# Patient Record
Sex: Male | Born: 1980 | Race: White | Hispanic: No | Marital: Single | Smoking: Former smoker
Health system: Southern US, Community
[De-identification: ages and names within clinical notes are randomized; demographics above are authoritative.]

## PROBLEM LIST (undated history)

## (undated) DIAGNOSIS — F419 Anxiety disorder, unspecified: Secondary | ICD-10-CM

## (undated) DIAGNOSIS — I471 Supraventricular tachycardia, unspecified: Secondary | ICD-10-CM

---

## 2000-03-17 ENCOUNTER — Emergency Department (HOSPITAL_COMMUNITY): Admission: EM | Admit: 2000-03-17 | Discharge: 2000-03-17 | Payer: Self-pay

## 2000-08-18 ENCOUNTER — Emergency Department (HOSPITAL_COMMUNITY): Admission: EM | Admit: 2000-08-18 | Discharge: 2000-08-18 | Payer: Self-pay | Admitting: Emergency Medicine

## 2000-09-20 ENCOUNTER — Emergency Department (HOSPITAL_COMMUNITY): Admission: EM | Admit: 2000-09-20 | Discharge: 2000-09-20 | Payer: Self-pay | Admitting: Emergency Medicine

## 2008-12-30 ENCOUNTER — Emergency Department (HOSPITAL_COMMUNITY): Admission: EM | Admit: 2008-12-30 | Discharge: 2008-12-30 | Payer: Self-pay | Admitting: Emergency Medicine

## 2009-08-16 ENCOUNTER — Emergency Department (HOSPITAL_COMMUNITY): Admission: EM | Admit: 2009-08-16 | Discharge: 2009-08-17 | Payer: Self-pay | Admitting: Emergency Medicine

## 2010-10-03 ENCOUNTER — Emergency Department (HOSPITAL_COMMUNITY)
Admission: EM | Admit: 2010-10-03 | Discharge: 2010-10-04 | Payer: Self-pay | Source: Home / Self Care | Admitting: Emergency Medicine

## 2010-10-15 ENCOUNTER — Emergency Department (HOSPITAL_COMMUNITY)
Admission: EM | Admit: 2010-10-15 | Discharge: 2010-10-15 | Payer: Self-pay | Source: Home / Self Care | Admitting: Emergency Medicine

## 2010-10-16 LAB — CBC
HCT: 38.7 % — ABNORMAL LOW (ref 39.0–52.0)
Hemoglobin: 12.7 g/dL — ABNORMAL LOW (ref 13.0–17.0)
MCH: 27.5 pg (ref 26.0–34.0)
MCHC: 32.8 g/dL (ref 30.0–36.0)
MCV: 83.9 fL (ref 78.0–100.0)
Platelets: 219 10*3/uL (ref 150–400)
RBC: 4.61 MIL/uL (ref 4.22–5.81)
RDW: 12.9 % (ref 11.5–15.5)
WBC: 6.1 10*3/uL (ref 4.0–10.5)

## 2010-10-16 LAB — DIFFERENTIAL
Basophils Absolute: 0 10*3/uL (ref 0.0–0.1)
Basophils Relative: 1 % (ref 0–1)
Eosinophils Absolute: 0.2 10*3/uL (ref 0.0–0.7)
Eosinophils Relative: 4 % (ref 0–5)
Lymphocytes Relative: 34 % (ref 12–46)
Lymphs Abs: 2.1 10*3/uL (ref 0.7–4.0)
Monocytes Absolute: 0.7 10*3/uL (ref 0.1–1.0)
Monocytes Relative: 12 % (ref 3–12)
Neutro Abs: 3 10*3/uL (ref 1.7–7.7)
Neutrophils Relative %: 49 % (ref 43–77)

## 2010-10-16 LAB — POCT I-STAT, CHEM 8
BUN: 8 mg/dL (ref 6–23)
Calcium, Ion: 1.04 mmol/L — ABNORMAL LOW (ref 1.12–1.32)
Chloride: 105 mEq/L (ref 96–112)
Creatinine, Ser: 0.9 mg/dL (ref 0.4–1.5)
Glucose, Bld: 101 mg/dL — ABNORMAL HIGH (ref 70–99)
HCT: 40 % (ref 39.0–52.0)
Hemoglobin: 13.6 g/dL (ref 13.0–17.0)
Potassium: 3.8 mEq/L (ref 3.5–5.1)
Sodium: 141 mEq/L (ref 135–145)
TCO2: 25 mmol/L (ref 0–100)

## 2010-10-16 LAB — POCT CARDIAC MARKERS
CKMB, poc: <1 ng/mL — ABNORMAL LOW (ref 1.0–8.0)
CKMB, poc: <1 ng/mL — ABNORMAL LOW (ref 1.0–8.0)
Myoglobin, poc: 71.3 ng/mL (ref 12–200)
Myoglobin, poc: 56.9 ng/mL (ref 12–200)
Troponin i, poc: <0.05 ng/mL (ref 0.00–0.09)
Troponin i, poc: <0.05 ng/mL (ref 0.00–0.09)

## 2010-10-16 LAB — D-DIMER, QUANTITATIVE: D-Dimer, Quant: <0.22 ug/mL-FEU (ref 0.00–0.48)

## 2010-11-13 ENCOUNTER — Emergency Department (HOSPITAL_COMMUNITY)
Admission: EM | Admit: 2010-11-13 | Discharge: 2010-11-13 | Disposition: A | Discharge status: Home / Self Care | Payer: 59 | Attending: Emergency Medicine | Admitting: Emergency Medicine

## 2010-11-13 ENCOUNTER — Emergency Department (HOSPITAL_COMMUNITY): Payer: 59

## 2010-11-13 DIAGNOSIS — R0602 Shortness of breath: Secondary | ICD-10-CM | POA: Insufficient documentation

## 2010-11-13 DIAGNOSIS — J45909 Unspecified asthma, uncomplicated: Secondary | ICD-10-CM | POA: Insufficient documentation

## 2010-11-13 DIAGNOSIS — R0789 Other chest pain: Secondary | ICD-10-CM | POA: Insufficient documentation

## 2010-11-13 DIAGNOSIS — I498 Other specified cardiac arrhythmias: Secondary | ICD-10-CM | POA: Insufficient documentation

## 2010-11-13 LAB — BASIC METABOLIC PANEL
BUN: 11 mg/dL (ref 6–23)
CO2: 20 mEq/L (ref 19–32)
Calcium: 8.8 mg/dL (ref 8.4–10.5)
Chloride: 107 mEq/L (ref 96–112)
Creatinine, Ser: 0.76 mg/dL (ref 0.4–1.5)
GFR calc Af Amer: >60 mL/min (ref >60)
GFR calc non Af Amer: >60 mL/min (ref >60)
Glucose, Bld: 119 mg/dL — ABNORMAL HIGH (ref 70–99)
Potassium: 3.5 mEq/L (ref 3.5–5.1)
Sodium: 139 mEq/L (ref 135–145)

## 2010-11-13 LAB — CBC
HCT: 42 % (ref 39.0–52.0)
Hemoglobin: 14.3 g/dL (ref 13.0–17.0)
MCH: 28.4 pg (ref 26.0–34.0)
MCHC: 34 g/dL (ref 30.0–36.0)
MCV: 83.5 fL (ref 78.0–100.0)
Platelets: 233 10*3/uL (ref 150–400)
RBC: 5.03 MIL/uL (ref 4.22–5.81)
RDW: 12.7 % (ref 11.5–15.5)
WBC: 6.2 10*3/uL (ref 4.0–10.5)

## 2010-11-13 LAB — POCT CARDIAC MARKERS
CKMB, poc: <1 ng/mL — ABNORMAL LOW (ref 1.0–8.0)
Myoglobin, poc: 73.8 ng/mL (ref 12–200)
Troponin i, poc: <0.05 ng/mL (ref 0.00–0.09)

## 2010-11-13 LAB — DIFFERENTIAL
Basophils Absolute: 0 10*3/uL (ref 0.0–0.1)
Basophils Relative: 0 % (ref 0–1)
Eosinophils Absolute: 0.2 10*3/uL (ref 0.0–0.7)
Eosinophils Relative: 3 % (ref 0–5)
Lymphocytes Relative: 32 % (ref 12–46)
Lymphs Abs: 2 10*3/uL (ref 0.7–4.0)
Monocytes Absolute: 0.5 10*3/uL (ref 0.1–1.0)
Monocytes Relative: 8 % (ref 3–12)
Neutro Abs: 3.5 10*3/uL (ref 1.7–7.7)
Neutrophils Relative %: 57 % (ref 43–77)

## 2010-11-13 LAB — CK TOTAL AND CKMB (NOT AT ARMC)
CK, MB: 1 ng/mL (ref 0.3–4.0)
Relative Index: INVALID (ref 0.0–2.5)
Total CK: 79 U/L (ref 7–232)

## 2010-11-13 LAB — TSH: TSH: 0.307 u[IU]/mL — ABNORMAL LOW (ref 0.350–4.500)

## 2010-11-13 LAB — TROPONIN I: Troponin I: 0.01 ng/mL (ref 0.00–0.06)

## 2010-11-14 LAB — HEMOGLOBIN A1C
Hgb A1c MFr Bld: 5.2 % (ref <5.7)
Mean Plasma Glucose: 103 mg/dL (ref <117)

## 2010-11-22 ENCOUNTER — Encounter (HOSPITAL_COMMUNITY): Payer: Self-pay

## 2010-11-22 ENCOUNTER — Other Ambulatory Visit (HOSPITAL_COMMUNITY): Payer: Self-pay | Admitting: Cardiology

## 2010-11-22 ENCOUNTER — Ambulatory Visit (HOSPITAL_COMMUNITY)
Admission: RE | Admit: 2010-11-22 | Discharge: 2010-11-22 | Disposition: A | Discharge status: Home / Self Care | Payer: 59 | Source: Ambulatory Visit | Attending: Cardiology | Admitting: Cardiology

## 2010-11-22 DIAGNOSIS — Q231 Congenital insufficiency of aortic valve: Secondary | ICD-10-CM

## 2010-11-22 DIAGNOSIS — R000 Tachycardia, unspecified: Secondary | ICD-10-CM | POA: Insufficient documentation

## 2010-11-22 DIAGNOSIS — Q2381 Bicuspid aortic valve: Secondary | ICD-10-CM

## 2010-11-22 DIAGNOSIS — I77811 Abdominal aortic ectasia: Secondary | ICD-10-CM | POA: Insufficient documentation

## 2010-11-22 HISTORY — PX: DOPPLER ECHOCARDIOGRAPHY: SHX263

## 2010-11-22 HISTORY — PX: NM MYOCAR PERF WALL MOTION: HXRAD629

## 2010-11-22 MED ORDER — IOHEXOL 300 MG/ML  SOLN
100.0000 mL | Freq: Once | INTRAMUSCULAR | Status: AC | PRN
  Administered 2010-11-22: 100 mL via INTRAVENOUS

## 2010-12-11 ENCOUNTER — Encounter: Payer: Self-pay | Admitting: Internal Medicine

## 2010-12-11 ENCOUNTER — Institutional Professional Consult (permissible substitution) (INDEPENDENT_AMBULATORY_CARE_PROVIDER_SITE_OTHER): Payer: 59 | Admitting: Internal Medicine

## 2010-12-11 DIAGNOSIS — R002 Palpitations: Secondary | ICD-10-CM | POA: Insufficient documentation

## 2010-12-11 DIAGNOSIS — G5600 Carpal tunnel syndrome, unspecified upper limb: Secondary | ICD-10-CM | POA: Insufficient documentation

## 2010-12-11 DIAGNOSIS — I498 Other specified cardiac arrhythmias: Secondary | ICD-10-CM | POA: Insufficient documentation

## 2010-12-11 DIAGNOSIS — J45909 Unspecified asthma, uncomplicated: Secondary | ICD-10-CM | POA: Insufficient documentation

## 2010-12-11 DIAGNOSIS — I10 Essential (primary) hypertension: Secondary | ICD-10-CM | POA: Insufficient documentation

## 2010-12-11 DIAGNOSIS — I471 Supraventricular tachycardia: Secondary | ICD-10-CM

## 2010-12-11 DIAGNOSIS — F411 Generalized anxiety disorder: Secondary | ICD-10-CM | POA: Insufficient documentation

## 2010-12-11 DIAGNOSIS — R079 Chest pain, unspecified: Secondary | ICD-10-CM | POA: Insufficient documentation

## 2010-12-11 DIAGNOSIS — G4730 Sleep apnea, unspecified: Secondary | ICD-10-CM | POA: Insufficient documentation

## 2010-12-17 NOTE — Assessment & Plan Note (Signed)
Summary: ep consult; svt ablation . [facility]. per [facility] office [phone]. pt f...   Visit Type:  Initial Consult Referring Provider:  Dr Clarene Duke Primary Provider:  none  CC:  SVT.  History of Present Illness: Bryan Perez is a pleasant 30 yo WM with a h/o documented SVT who presents today for EP consultation.  He reports initially having abrupt onset/ termination of tachypalpitations 18 months ago.  He reports associated anxiety, claminess,  and diaphoresis.  He denies CP but reports mild SOB.  Episodes occur at rest.  He reports that episodes having occured twice after lifting up after bending over to pick up something off of the floor.  He has tried vagal maneuvers without relief.  He has had 3 episodes in the last year and a half.  Episodes have terminated with adenosine.  He is otherwise quite active, without symptoms of CHF or ischemia.  He denies prior presyncope or syncope.  Current Medications (verified): 1)  Alprazolam 0.25 Mg Tabs (Alprazolam) .... Two Times A Day 2)  Diltiazem Hcl Er Beads 240 Mg Xr24h-Cap (Diltiazem Hcl Er Beads) .... Take One Capsule By Mouth Daily  Allergies (verified): No Known Drug Allergies  Past History:  Past Medical History: HYPERTENSION SUPRAVENTRICULAR TACHYCARDIA  Obesity Snores ANXIETY CARPAL TUNNEL SYNDROME ASTHMA Bicuspid aortic valve with mild/moderate AI LVEDD 61 mm  Past Surgical History: none  Family History: Reviewed history from 12/11/2010 and no changes required. MI CAD  Social History: Single and lives in [place]. Shop Teacher, English as a foreign language Tobacco Use - Former. (quit 8/10) Alcohol Use - none  Review of Systems       All systems are reviewed and negative except as listed in the HPI.   Vital Signs:  Patient profile:   30 year old male Height:      72 inches Weight:      291 pounds BMI:     39.61 Pulse rate:   64 / minute BP sitting:   156 / 86  (left arm)  Vitals Entered By: Laurance Flatten CMA (December 11, 2010 3:19  PM)  Physical Exam  General:  overweight, NAD Head:  normocephalic and atraumatic Eyes:  PERRLA/EOM intact; conjunctiva and lids normal. Mouth:  Teeth, gums and palate normal. Oral mucosa normal. Neck:  supple Lungs:  Clear bilaterally to auscultation and percussion. Heart:  Non-displaced PMI, chest non-tender; regular rate and rhythm, S1, S2 without murmurs, rubs or gallops. Carotid upstroke normal, no bruit. Normal abdominal aortic size, no bruits. Femorals normal pulses, no bruits. Pedals normal pulses. No edema, no varicosities. Abdomen:  Bowel sounds positive; abdomen soft and non-tender without masses, organomegaly, or hernias noted. No hepatosplenomegaly. Msk:  Back normal, normal gait. Muscle strength and tone normal. Extremities:  No clubbing or cyanosis. Neurologic:  Alert and oriented x 3. Skin:  Intact without lesions or rashes. Cervical Nodes:  no significant adenopathy Psych:  Normal affect.   Echocardiogram  Procedure date:  11/22/2010  Findings:      LVEF >55%, nl RV size and function, RA/LA size nl, mild Bryan, bicupsid aortic valve with mild to moderate AI, LVEDD 61  Nuclear ETT  Procedure date:  11/22/2010  Findings:      bruce myoview from [facility] office  exercised for 7:36 minutes, 88% max heart rate acheived,  EF 57% possible mild ischemia in the anterolateral territory vs chest wall artifact  EKG  Procedure date:  11/22/2010  Findings:      sinus rhythm 68 bpm, PR 140, otherwise normal  ekg  EKG  Procedure date:  11/13/2010  Findings:      SVT at 208 bpm,  short RP tachycardia  Impression & Recommendations:  Problem # 1:  SUPRAVENTRICULAR TACHYCARDIA (ICD-427.89) Bryan Kafer has clearly documented and quite symptomatic recurrent short RP tachycardia which has previously terminated with adenosine.  This is almost certainly a re-entrant rhythm involving the AV node for at least the anterograde circuit.  Therapeutic strategies for SVT including  medicine and ablation were discussed in detail with the patient today. Risk, benefits, and alternatives to EP study and radiofrequency ablation  were also discussed in detail today. These risks include but are not limited to stroke, bleeding, vascular damage, tamponade, perforation, damage to the heart and other structures, AV block requiring a pacemaker, and death. The patient understands these risk and wishes to further contemplate ablation.  He will continue cardizem and contact our office if he wishes to proceed with ablation. I was quite clear that I thought that he should strongly consider ablation.  If he decides in the future to proceed with ablation, then he will contact my office, otherwise he will continue to follow-up with Dr Clarene Duke.  Problem # 2:  HYPERTENSION (ICD-401.9) salt restriction continue cardizem  Problem # 3:  SLEEP APNEA (ICD-780.57) He thinks that he may have "sleep apnea".  He snores frequently.  His body habitus certainly raises the likelyhood of sleep apnea.  I have encouraged him to follow-up with Dr Clarene Duke for consideration of a sleep study by Dr Tresa Endo.  Patient Instructions: 1)  Your physician has recommended that you have an ablation.  Catheter ablation is a medical procedure used to treat some cardiac arrhythmias (irregular heartbeats). During catheter ablation, a long, thin, flexible tube is put into a blood vessel in your groin (upper thigh), or neck. This tube is called an ablation catheter. It is then guided to your heart through the blood vessel. Radiofrequency waves destroy small areas of heart tissue where abnormal heartbeats may cause an arrhythmia to start.  Please see the instruction sheet given to you today. 2)  Patient will call us if he wants to proceed with SVT ablation

## 2011-01-01 LAB — CBC
HCT: 40.6 % (ref 39.0–52.0)
Hemoglobin: 14 g/dL (ref 13.0–17.0)
MCHC: 34.6 g/dL (ref 30.0–36.0)
MCV: 86.3 fL (ref 78.0–100.0)
Platelets: 172 10*3/uL (ref 150–400)
RBC: 4.7 MIL/uL (ref 4.22–5.81)
RDW: 13 % (ref 11.5–15.5)
WBC: 7.2 10*3/uL (ref 4.0–10.5)

## 2011-01-01 LAB — COMPREHENSIVE METABOLIC PANEL
ALT: 34 U/L (ref 0–53)
AST: 25 U/L (ref 0–37)
Albumin: 3.7 g/dL (ref 3.5–5.2)
Alkaline Phosphatase: 77 U/L (ref 39–117)
BUN: 9 mg/dL (ref 6–23)
CO2: 28 mEq/L (ref 19–32)
Calcium: 8.7 mg/dL (ref 8.4–10.5)
Chloride: 106 mEq/L (ref 96–112)
Creatinine, Ser: 0.76 mg/dL (ref 0.4–1.5)
GFR calc Af Amer: >60 mL/min (ref >60)
GFR calc non Af Amer: >60 mL/min (ref >60)
Glucose, Bld: 128 mg/dL — ABNORMAL HIGH (ref 70–99)
Potassium: 4 mEq/L (ref 3.5–5.1)
Sodium: 140 mEq/L (ref 135–145)
Total Bilirubin: 0.4 mg/dL (ref 0.3–1.2)
Total Protein: 6.6 g/dL (ref 6.0–8.3)

## 2011-01-01 LAB — DIFFERENTIAL
Basophils Absolute: 0 10*3/uL (ref 0.0–0.1)
Basophils Relative: 1 % (ref 0–1)
Eosinophils Absolute: 0.2 10*3/uL (ref 0.0–0.7)
Eosinophils Relative: 2 % (ref 0–5)
Lymphocytes Relative: 28 % (ref 12–46)
Lymphs Abs: 2 10*3/uL (ref 0.7–4.0)
Monocytes Absolute: 0.9 10*3/uL (ref 0.1–1.0)
Monocytes Relative: 12 % (ref 3–12)
Neutro Abs: 4.2 10*3/uL (ref 1.7–7.7)
Neutrophils Relative %: 58 % (ref 43–77)

## 2011-01-01 LAB — POCT CARDIAC MARKERS
CKMB, poc: <1 ng/mL — ABNORMAL LOW (ref 1.0–8.0)
Myoglobin, poc: 59.1 ng/mL (ref 12–200)
Troponin i, poc: <0.05 ng/mL (ref 0.00–0.09)

## 2011-01-08 LAB — POCT CARDIAC MARKERS
CKMB, poc: 1.1 ng/mL (ref 1.0–8.0)
Myoglobin, poc: 79.7 ng/mL (ref 12–200)
Troponin i, poc: <0.05 ng/mL (ref 0.00–0.09)

## 2011-01-08 LAB — POCT I-STAT, CHEM 8
BUN: 12 mg/dL (ref 6–23)
Calcium, Ion: 1.16 mmol/L (ref 1.12–1.32)
Chloride: 105 meq/L (ref 96–112)
Creatinine, Ser: 0.8 mg/dL (ref 0.4–1.5)
Glucose, Bld: 95 mg/dL (ref 70–99)
HCT: 46 % (ref 39.0–52.0)
Hemoglobin: 15.6 g/dL (ref 13.0–17.0)
Potassium: 4.1 mEq/L (ref 3.5–5.1)
Sodium: 140 meq/L (ref 135–145)
TCO2: 28 mmol/L (ref 0–100)

## 2011-01-08 LAB — CBC
HCT: 44.2 % (ref 39.0–52.0)
Hemoglobin: 15.5 g/dL (ref 13.0–17.0)
MCHC: 35.2 g/dL (ref 30.0–36.0)
MCV: 85.8 fL (ref 78.0–100.0)
Platelets: 213 10*3/uL (ref 150–400)
RBC: 5.15 MIL/uL (ref 4.22–5.81)
RDW: 12.9 % (ref 11.5–15.5)
WBC: 6.8 10*3/uL (ref 4.0–10.5)

## 2011-01-08 LAB — DIFFERENTIAL
Basophils Absolute: 0 10*3/uL (ref 0.0–0.1)
Basophils Relative: 0 % (ref 0–1)
Eosinophils Absolute: 0.2 10*3/uL (ref 0.0–0.7)
Eosinophils Relative: 3 % (ref 0–5)
Lymphocytes Relative: 26 % (ref 12–46)
Lymphs Abs: 1.8 10*3/uL (ref 0.7–4.0)
Monocytes Absolute: 0.6 10*3/uL (ref 0.1–1.0)
Monocytes Relative: 8 % (ref 3–12)
Neutro Abs: 4.2 10*3/uL (ref 1.7–7.7)
Neutrophils Relative %: 62 % (ref 43–77)

## 2011-12-25 ENCOUNTER — Other Ambulatory Visit: Payer: Self-pay

## 2011-12-25 ENCOUNTER — Encounter (HOSPITAL_COMMUNITY): Payer: Self-pay

## 2011-12-25 ENCOUNTER — Emergency Department (HOSPITAL_COMMUNITY)
Admission: EM | Admit: 2011-12-25 | Discharge: 2011-12-25 | Disposition: A | Discharge status: Home / Self Care | Payer: 59 | Attending: Emergency Medicine | Admitting: Emergency Medicine

## 2011-12-25 DIAGNOSIS — R000 Tachycardia, unspecified: Secondary | ICD-10-CM | POA: Insufficient documentation

## 2011-12-25 HISTORY — DX: Supraventricular tachycardia: I47.1

## 2011-12-25 HISTORY — DX: Supraventricular tachycardia, unspecified: I47.10

## 2011-12-25 NOTE — Discharge Instructions (Signed)
Continue your diltiazem as prescribed.  Do your best not to miss any doses.  Follow up with Dr. Johney Frame.  You may return to the ER if your symptoms return or you have any other concerns.

## 2011-12-25 NOTE — ED Provider Notes (Signed)
Date: 12/25/2011  Rate: 96  Rhythm: normal sinus rhythm  QRS Axis: normal  Intervals: normal  ST/T Wave abnormalities: normal  Conduction Disutrbances:none  Narrative Interpretation: Normal EKG  Old EKG Reviewed: unchanged    Carleene Cooper III, MD 12/25/11 1550

## 2011-12-25 NOTE — ED Provider Notes (Signed)
History     CSN: [id number]  Arrival date & time 12/25/11  1525   First MD Initiated Contact with Patient 12/25/11 1707      Chief Complaint  Patient presents with  . Irregular Heart Beat    (Consider location/radiation/quality/duration/timing/severity/associated sxs/prior treatment) HPI History provided by pt.   Pt has h/o SVT. 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  It is typical for symptoms to occur at rest.  Tachyarrhythmia lasted for approx one hour and then resolved spontaneously upon arriving in ED.   EMS reported a HR of 186.  No associated CP, SOB, lightheadedness or syncope.  Denies recent illnesses including fever, cough, vomiting, diarrhea, urinary sx.   Has had good rhythm control w/ diltiazem over the past year and most recent episode of SVT was in 10/2010.   However, pt has missed approx 10 doses of diltiazem over the past few weeks d/t forgetfulness.   Per prior chart, was seen by Dr. Johney Frame in 11/2010 and radioablation recommended but pt declined at that time.   Past Medical History  Diagnosis Date  . SVT (supraventricular tachycardia)     No past surgical history on file.  No family history on file.  History  Substance Use Topics  . Smoking status: Not on file  . Smokeless tobacco: Not on file  . Alcohol Use:       Review of Systems  All other systems reviewed and are negative.    Allergies  Review of patient's allergies indicates no known allergies.  Home Medications   Current Outpatient Rx  Name Route Sig Dispense Refill  . ALPRAZOLAM 1 MG PO TABS Oral Take 0.5 mg by mouth 2 (two) times daily.    Marland Kitchen DILTIAZEM HCL ER BEADS 240 MG PO CP24 Oral Take 240 mg by mouth daily.      BP 130/75  Pulse 87  Temp(Src) 99.6 F (37.6 C) (Oral)  Resp 17  SpO2 97%  Physical Exam  Nursing note and vitals reviewed. Constitutional: He is oriented to person, place, and time. He appears  well-developed and well-nourished. No distress.  HENT:  Head: Normocephalic and atraumatic.  Eyes:       Normal appearance  Neck: Normal range of motion.  Cardiovascular: Normal rate and regular rhythm.        HR 85  Pulmonary/Chest: Effort normal and breath sounds normal.  Neurological: He is alert and oriented to person, place, and time.  Skin: Skin is warm and dry. No rash noted.  Psychiatric: He has a normal mood and affect. His behavior is normal.    ED Course  Procedures (including critical care time)  Labs Reviewed - No data to display No results found.   1. Tachyarrhythmia       MDM  Pt w/ h/o SVT presents w/ episode of tachyarrhythmia w/out associated sx today.  EMS reported HR of 186 but resolved spontaneously upon arrival to ED and pt currently in sinus rhythm.  Likely had SVT secondary to non-compliance w/ diltiazem.  Has missed several doses d/t forgetfulness over past few weeks.  I explained the importance of compliance and gave him recommendations for remembering his medication.  Referred back to Dr. Johney Frame who has evaluated him for this problem in the past.  Return precautions discussed.         Arie Sabina [place], [place] 12/25/11 1750

## 2011-12-25 NOTE — ED Notes (Addendum)
Pt arrived via EMS, pt has hx of SVT, states that he has been forgetting to take his home meds. Per EMS pt's HR was in the 180's.

## 2011-12-26 NOTE — ED Provider Notes (Signed)
Medical screening examination/treatment/procedure(s) were performed by non-physician practitioner and as supervising physician I was immediately available for consultation/collaboration.   Carleene Cooper III, MD 12/26/11 1438

## 2013-03-18 ENCOUNTER — Other Ambulatory Visit: Payer: Self-pay | Admitting: Pharmacist Clinician (PhC)/ Clinical Pharmacy Specialist

## 2013-03-18 MED ORDER — DILTIAZEM HCL ER BEADS 240 MG PO CP24: ORAL_CAPSULE | ORAL | Status: DC

## 2013-04-20 ENCOUNTER — Ambulatory Visit: Payer: 59 | Admitting: Cardiology

## 2013-04-20 ENCOUNTER — Other Ambulatory Visit: Payer: Self-pay | Admitting: Cardiology

## 2013-05-01 ENCOUNTER — Encounter: Payer: Self-pay | Admitting: *Deleted

## 2013-05-02 ENCOUNTER — Encounter: Payer: Self-pay | Admitting: Cardiology

## 2013-05-03 ENCOUNTER — Ambulatory Visit: Payer: 59 | Admitting: Cardiology

## 2013-05-22 ENCOUNTER — Other Ambulatory Visit: Payer: Self-pay | Admitting: Cardiology

## 2013-09-23 ENCOUNTER — Emergency Department (HOSPITAL_COMMUNITY)
Admission: EM | Admit: 2013-09-23 | Discharge: 2013-09-23 | Disposition: A | Discharge status: Home / Self Care | Payer: BC Managed Care – PPO | Attending: Emergency Medicine | Admitting: Emergency Medicine

## 2013-09-23 ENCOUNTER — Encounter (HOSPITAL_COMMUNITY): Payer: Self-pay

## 2013-09-23 ENCOUNTER — Emergency Department (HOSPITAL_COMMUNITY): Payer: BC Managed Care – PPO

## 2013-09-23 DIAGNOSIS — M545 Low back pain, unspecified: Secondary | ICD-10-CM | POA: Insufficient documentation

## 2013-09-23 DIAGNOSIS — Z79899 Other long term (current) drug therapy: Secondary | ICD-10-CM | POA: Insufficient documentation

## 2013-09-23 DIAGNOSIS — M549 Dorsalgia, unspecified: Secondary | ICD-10-CM

## 2013-09-23 DIAGNOSIS — K2970 Gastritis, unspecified, without bleeding: Secondary | ICD-10-CM

## 2013-09-23 DIAGNOSIS — R109 Unspecified abdominal pain: Secondary | ICD-10-CM

## 2013-09-23 DIAGNOSIS — Z8679 Personal history of other diseases of the circulatory system: Secondary | ICD-10-CM | POA: Insufficient documentation

## 2013-09-23 LAB — COMPREHENSIVE METABOLIC PANEL
Albumin: 3.8 g/dL (ref 3.5–5.2)
Alkaline Phosphatase: 59 U/L (ref 39–117)
BUN: 8 mg/dL (ref 6–23)
Calcium: 8.9 mg/dL (ref 8.4–10.5)
Creatinine, Ser: 0.76 mg/dL (ref 0.50–1.35)
GFR calc Af Amer: >90 mL/min (ref >90)
Glucose, Bld: 91 mg/dL (ref 70–99)
Total Protein: 6.9 g/dL (ref 6.0–8.3)

## 2013-09-23 LAB — URINALYSIS, ROUTINE W REFLEX MICROSCOPIC
Bilirubin Urine: NEGATIVE
Hgb urine dipstick: NEGATIVE
Ketones, ur: NEGATIVE mg/dL
Nitrite: NEGATIVE
Protein, ur: NEGATIVE mg/dL
Specific Gravity, Urine: 1.009 (ref 1.005–1.030)
Urobilinogen, UA: 0.2 mg/dL (ref 0.0–1.0)

## 2013-09-23 LAB — CBC WITH DIFFERENTIAL/PLATELET
Basophils Absolute: 0 10*3/uL (ref 0.0–0.1)
Eosinophils Relative: 6 % — ABNORMAL HIGH (ref 0–5)
HCT: 47.2 % (ref 39.0–52.0)
Hemoglobin: 16.6 g/dL (ref 13.0–17.0)
Lymphocytes Relative: 29 % (ref 12–46)
Lymphs Abs: 2.5 10*3/uL (ref 0.7–4.0)
MCV: 85.2 fL (ref 78.0–100.0)
Monocytes Absolute: 0.8 10*3/uL (ref 0.1–1.0)
Neutro Abs: 4.7 10*3/uL (ref 1.7–7.7)
RBC: 5.54 MIL/uL (ref 4.22–5.81)
RDW: 12.8 % (ref 11.5–15.5)
WBC: 8.5 10*3/uL (ref 4.0–10.5)

## 2013-09-23 MED ORDER — SODIUM CHLORIDE 0.9 % IV BOLUS (SEPSIS)
1000.0000 mL | Freq: Once | INTRAVENOUS | Status: AC
  Administered 2013-09-23: 1000 mL via INTRAVENOUS

## 2013-09-23 MED ORDER — IOHEXOL 300 MG/ML  SOLN
100.0000 mL | Freq: Once | INTRAMUSCULAR | Status: AC | PRN
  Administered 2013-09-23: 100 mL via INTRAVENOUS

## 2013-09-23 MED ORDER — IOHEXOL 300 MG/ML  SOLN
50.0000 mL | Freq: Once | INTRAMUSCULAR | Status: AC | PRN
  Administered 2013-09-23: 50 mL via ORAL

## 2013-09-23 NOTE — ED Provider Notes (Signed)
Care assumed from [place], NP at shift change. Pt with epigastric abdominal and back pain. CT abdomen pending. 6:14 AM CT results show no acute findings. On exam, pt is well appearing and in NAD, abdomen is soft and non-tender. Stable vital signs. Discussed CT findings. He is stable for discharge. Return precautions given. Patient states understanding of treatment care plan and is agreeable.   Trevor Mace, PA-C 09/23/13 [phone]

## 2013-09-23 NOTE — ED Notes (Signed)
Pt c/o of abd pain x 3 days. Pt states pain started in bil lower back pain. Pt states pain is more epigrastric pain. Pain does not radiate. Pt no nausea but increased bm w/o diarrhea. No vomitting.

## 2013-09-23 NOTE — ED Provider Notes (Signed)
CSN: [id number]     Arrival date & time 09/23/13  0139 History   First MD Initiated Contact with Patient 09/23/13 [phone]     Chief Complaint  Patient presents with  . Abdominal Pain   (Consider location/radiation/quality/duration/timing/severity/associated sxs/prior Treatment) Patient is a 32 y.o. male presenting with abdominal pain. The history is provided by the patient.  Abdominal Pain Pain location:  R flank Pain quality: aching   Pain radiates to:  Epigastric region Pain severity:  Moderate Onset quality:  Gradual Duration:  2 days Timing:  Constant Progression:  Improving Chronicity:  New Context: not alcohol use, not awakening from sleep, not diet changes, not eating, not laxative use, not recent travel and not retching   Relieved by:  Nothing Worsened by:  Movement Ineffective treatments:  Acetaminophen Associated symptoms: no chills, no dysuria, no fever, no hematuria, no nausea, no shortness of breath and no vomiting   Associated symptoms comment:  Early saity   Past Medical History  Diagnosis Date  . SVT (supraventricular tachycardia)    Past Surgical History  Procedure Laterality Date  . Doppler echocardiography  11/22/2010    EF =>55%, LV NORM  . Nm myocar perf wall motion  11/22/2010    EF 57%,lv norm   Family History  Problem Relation Age of Onset  . Heart attack Paternal Grandfather 40    first MI   History  Substance Use Topics  . Smoking status: Not on file  . Smokeless tobacco: Not on file  . Alcohol Use: Not on file    Review of Systems  Constitutional: Negative for fever and chills.  Respiratory: Negative for shortness of breath.   Gastrointestinal: Positive for abdominal pain. Negative for nausea and vomiting.  Genitourinary: Positive for flank pain. Negative for dysuria, frequency and hematuria.  Musculoskeletal: Positive for back pain.  Skin: Negative for rash and wound.  All other systems reviewed and are negative.    Allergies    Review of patient's allergies indicates no known allergies.  Home Medications   Current Outpatient Rx  Name  Route  Sig  Dispense  Refill  . ALPRAZolam (XANAX) 1 MG tablet   Oral   Take 0.5 mg by mouth 2 (two) times daily.         Marland Kitchen amphetamine-dextroamphetamine (ADDERALL) 20 MG tablet   Oral   Take 20 mg by mouth 2 (two) times daily.          BP 145/74  Pulse 81  Temp(Src) 97.8 F (36.6 C) (Oral)  Resp 16  SpO2 100% Physical Exam  Nursing note and vitals reviewed. Constitutional: He appears well-developed and well-nourished. No distress.  HENT:  Head: Normocephalic and atraumatic.  Eyes: Pupils are equal, round, and reactive to light.  Neck: Normal range of motion.  Cardiovascular: Normal rate and regular rhythm.   Pulmonary/Chest: Effort normal and breath sounds normal.  Abdominal: Soft. Bowel sounds are normal. He exhibits no distension. There is no tenderness.  Musculoskeletal: Normal range of motion. He exhibits no edema.       Lumbar back: He exhibits tenderness and pain. He exhibits normal range of motion, no edema and no spasm.       Back:  Neurological: He is alert.  Skin: Skin is warm. No rash noted. No pallor.    ED Course  Procedures (including critical care time) Labs Review Labs Reviewed  COMPREHENSIVE METABOLIC PANEL - Abnormal; Notable for the following:    Sodium 131 (*)    All  other components within normal limits  CBC WITH DIFFERENTIAL - Abnormal; Notable for the following:    Eosinophils Relative 6 (*)    All other components within normal limits  URINALYSIS, ROUTINE W REFLEX MICROSCOPIC   Imaging Review No results found.  EKG Interpretation   None       MDM   1. Abdominal pain   2. Back pain   3. Gastritis        Arman Filter, NP 10/03/13 1953

## 2013-10-06 NOTE — ED Provider Notes (Signed)
Medical screening examination/treatment/procedure(s) were performed by non-physician practitioner and as supervising physician I was immediately available for consultation/collaboration.  EKG Interpretation   None         Hanley SeamenJohn L Alta Goding, MD 10/06/13 1203

## 2014-01-03 ENCOUNTER — Encounter (HOSPITAL_COMMUNITY): Payer: Self-pay | Admitting: Emergency Medicine

## 2014-01-03 ENCOUNTER — Emergency Department (HOSPITAL_COMMUNITY)
Admission: EM | Admit: 2014-01-03 | Discharge: 2014-01-03 | Disposition: A | Discharge status: Home / Self Care | Payer: BC Managed Care – PPO | Attending: Emergency Medicine | Admitting: Emergency Medicine

## 2014-01-03 ENCOUNTER — Emergency Department (HOSPITAL_COMMUNITY): Payer: BC Managed Care – PPO

## 2014-01-03 DIAGNOSIS — Z23 Encounter for immunization: Secondary | ICD-10-CM | POA: Insufficient documentation

## 2014-01-03 DIAGNOSIS — Z87891 Personal history of nicotine dependence: Secondary | ICD-10-CM | POA: Insufficient documentation

## 2014-01-03 DIAGNOSIS — W311XXA Contact with metalworking machines, initial encounter: Secondary | ICD-10-CM | POA: Insufficient documentation

## 2014-01-03 DIAGNOSIS — Y990 Civilian activity done for income or pay: Secondary | ICD-10-CM | POA: Insufficient documentation

## 2014-01-03 DIAGNOSIS — S61011A Laceration without foreign body of right thumb without damage to nail, initial encounter: Secondary | ICD-10-CM

## 2014-01-03 DIAGNOSIS — Z8679 Personal history of other diseases of the circulatory system: Secondary | ICD-10-CM | POA: Insufficient documentation

## 2014-01-03 DIAGNOSIS — Y939 Activity, unspecified: Secondary | ICD-10-CM | POA: Insufficient documentation

## 2014-01-03 DIAGNOSIS — Y929 Unspecified place or not applicable: Secondary | ICD-10-CM | POA: Insufficient documentation

## 2014-01-03 DIAGNOSIS — S61209A Unspecified open wound of unspecified finger without damage to nail, initial encounter: Secondary | ICD-10-CM | POA: Insufficient documentation

## 2014-01-03 DIAGNOSIS — Z79899 Other long term (current) drug therapy: Secondary | ICD-10-CM | POA: Insufficient documentation

## 2014-01-03 DIAGNOSIS — S61409A Unspecified open wound of unspecified hand, initial encounter: Secondary | ICD-10-CM | POA: Insufficient documentation

## 2014-01-03 DIAGNOSIS — Z792 Long term (current) use of antibiotics: Secondary | ICD-10-CM | POA: Insufficient documentation

## 2014-01-03 DIAGNOSIS — F411 Generalized anxiety disorder: Secondary | ICD-10-CM | POA: Insufficient documentation

## 2014-01-03 HISTORY — DX: Anxiety disorder, unspecified: F41.9

## 2014-01-03 MED ORDER — OXYCODONE-ACETAMINOPHEN 7.5-325 MG PO TABS: 1.0000 | ORAL_TABLET | ORAL | Status: AC | PRN

## 2014-01-03 MED ORDER — LIDOCAINE HCL (PF) 1 % IJ SOLN: 5.0000 mL | Freq: Once | INTRAMUSCULAR | Status: DC

## 2014-01-03 MED ORDER — ONDANSETRON HCL 4 MG/2ML IJ SOLN
4.0000 mg | Freq: Once | INTRAMUSCULAR | Status: AC
  Administered 2014-01-03: 4 mg via INTRAVENOUS
  Filled 2014-01-03: qty 2

## 2014-01-03 MED ORDER — CEFAZOLIN SODIUM 1-5 GM-% IV SOLN: 1.0000 g | Freq: Once | INTRAVENOUS | Status: DC

## 2014-01-03 MED ORDER — TETANUS-DIPHTH-ACELL PERTUSSIS 5-2.5-18.5 LF-MCG/0.5 IM SUSP
0.5000 mL | Freq: Once | INTRAMUSCULAR | Status: AC
  Administered 2014-01-03: 0.5 mL via INTRAMUSCULAR
  Filled 2014-01-03: qty 0.5

## 2014-01-03 MED ORDER — CEPHALEXIN 500 MG PO CAPS: 500.0000 mg | ORAL_CAPSULE | Freq: Four times a day (QID) | ORAL | Status: DC

## 2014-01-03 MED ORDER — SODIUM CHLORIDE 0.9 % IV BOLUS (SEPSIS)
1000.0000 mL | Freq: Once | INTRAVENOUS | Status: AC
  Administered 2014-01-03: 1000 mL via INTRAVENOUS

## 2014-01-03 MED ORDER — HYDROMORPHONE HCL PF 1 MG/ML IJ SOLN
1.0000 mg | Freq: Once | INTRAMUSCULAR | Status: AC
  Administered 2014-01-03: 1 mg via INTRAVENOUS
  Filled 2014-01-03: qty 1

## 2014-01-03 NOTE — ED Notes (Signed)
MD at bedside. 

## 2014-01-03 NOTE — ED Provider Notes (Signed)
CSN: [id number]     Arrival date & time 01/03/14  1342 History   First MD Initiated Contact with Patient 01/03/14 1504     Chief Complaint  Patient presents with  . Laceration    right hand     (Consider location/radiation/quality/duration/timing/severity/associated sxs/prior Treatment) Patient is a 33 y.o. male presenting with skin laceration. The history is provided by the patient.  Laceration Location:  Hand Hand laceration location:  R palm Length (cm):  6 Depth:  Through underlying tissue Quality: straight   Bleeding: controlled with pressure   Time since incident:  2 hours Injury mechanism: pt works with drill bit and the drill kicked back and cut palmar area of R proximal thumb. Pain details:    Quality:  Aching   Severity:  Moderate   Timing:  Constant   Progression:  Unchanged Foreign body present:  Unable to specify Relieved by:  Nothing Worsened by:  Nothing tried Ineffective treatments:  None tried Tetanus status:  Unknown   Past Medical History  Diagnosis Date  . SVT (supraventricular tachycardia)   . Anxiety    Past Surgical History  Procedure Laterality Date  . Doppler echocardiography  11/22/2010    EF =>55%, LV NORM  . Nm myocar perf wall motion  11/22/2010    EF 57%,lv norm   Family History  Problem Relation Age of Onset  . Heart attack Paternal Grandfather 40    first MI   History  Substance Use Topics  . Smoking status: Former Games developer  . Smokeless tobacco: Not on file  . Alcohol Use: Yes     Comment: occasionally     Review of Systems  Constitutional: Negative for fever, activity change and appetite change.  HENT: Negative for congestion and rhinorrhea.   Eyes: Negative for discharge and itching.  Respiratory: Negative for cough, shortness of breath and wheezing.   Cardiovascular: Negative for chest pain.  Gastrointestinal: Negative for nausea, vomiting, abdominal pain, diarrhea and constipation.  Genitourinary: Negative for  hematuria, decreased urine volume and difficulty urinating.  Skin: Positive for wound. Negative for rash.  Neurological: Negative for syncope, weakness and numbness.  All other systems reviewed and are negative.      Allergies  Review of patient's allergies indicates no known allergies.  Home Medications   Current Outpatient Rx  Name  Route  Sig  Dispense  Refill  . acetaminophen (TYLENOL) 500 MG tablet   Oral   Take 1,500 mg by mouth every 6 (six) hours as needed for mild pain.         Marland Kitchen ALPRAZolam (XANAX) 1 MG tablet   Oral   Take 0.5 mg by mouth 2 (two) times daily.         Marland Kitchen amphetamine-dextroamphetamine (ADDERALL) 20 MG tablet   Oral   Take 20 mg by mouth 2 (two) times daily.         . cephALEXin (KEFLEX) 500 MG capsule   Oral   Take 1 capsule (500 mg total) by mouth 4 (four) times daily.   28 capsule   0   . oxyCODONE-acetaminophen (PERCOCET) 7.5-325 MG per tablet   Oral   Take 1 tablet by mouth every 4 (four) hours as needed for pain.   20 tablet   0    BP 133/71  Pulse 85  Temp(Src) 98.1 F (36.7 C) (Oral)  Resp 20  SpO2 99% Physical Exam  Vitals reviewed. Constitutional: He is oriented to person, place, and time. He appears well-developed  and well-nourished. No distress.  HENT:  Head: Normocephalic and atraumatic.  Mouth/Throat: Oropharynx is clear and moist. No oropharyngeal exudate.  Eyes: Conjunctivae and EOM are normal. Pupils are equal, round, and reactive to light. Right eye exhibits no discharge. Left eye exhibits no discharge. No scleral icterus.  Neck: Normal range of motion. Neck supple.  Cardiovascular: Normal rate, regular rhythm, normal heart sounds and intact distal pulses.  Exam reveals no gallop and no friction rub.   No murmur heard. Pulmonary/Chest: Effort normal and breath sounds normal. No respiratory distress. He has no wheezes. He has no rales.  Abdominal: Soft. He exhibits no distension and no mass. There is no  tenderness.  Musculoskeletal:  Proximal R thumb, palmar aspect, straight laceration, approximates mildly poorly, on palmar aspect wrapping to radial aspect. NVI distally. No active bleeding currently  Neurological: He is alert and oriented to person, place, and time. No cranial nerve deficit. He exhibits normal muscle tone. Coordination normal.  Skin: Skin is warm. No rash noted. He is not diaphoretic.    ED Course  LACERATION REPAIR Date/Time: 01/03/2014 11:19 PM Performed by: Pilar JarvisBRTALIK, Dominick Morella Authorized by: Dagmar HaitWALDEN, WILLIAM BLAIR Consent: Verbal consent obtained. Risks and benefits: risks, benefits and alternatives were discussed Consent given by: patient Patient understanding: patient states understanding of the procedure being performed Patient consent: the patient's understanding of the procedure matches consent given Site marked: the operative site was marked Imaging studies: imaging studies available Required items: required blood products, implants, devices, and special equipment available Patient identity confirmed: verbally with patient, arm band and hospital-assigned identification number Time out: Immediately prior to procedure a "time out" was called to verify the correct patient, procedure, equipment, support staff and site/side marked as required. Body area: upper extremity Location details: right hand Laceration length: 6 cm Contamination: The wound is contaminated. Foreign body present: grease from work. Tendon involvement: none Nerve involvement: none Vascular damage: no Anesthesia: digital block Local anesthetic: lidocaine 1% with epinephrine Anesthetic total: 5 ml Patient sedated: no Preparation: Patient was prepped and draped in the usual sterile fashion. Irrigation solution: saline Irrigation method: jet lavage Amount of cleaning: standard Debridement: minimal Degree of undermining: none Wound skin closure material used: 4-0 Vicryl. Subcutaneous closure: 4-0  Vicryl Fascia closure: 4-0 Vicryl Number of sutures: 9 Technique: simple Approximation: close Approximation difficulty: complex Dressing: antibiotic ointment, 4x4 sterile gauze and gauze roll Patient tolerance: Patient tolerated the procedure well with no immediate complications.   (including critical care time) Labs Review Labs Reviewed - No data to display Imaging Review Dg Finger Thumb Right  01/03/2014   CLINICAL DATA:  Laceration.  Pain.  EXAM: RIGHT THUMB 2+V  COMPARISON:  None.  FINDINGS: There is no evidence of fracture or dislocation. There is no evidence of arthropathy or other focal bone abnormality. Soft tissues are unremarkable  IMPRESSION: Negative.   Electronically Signed   By: Davonna BellingJohn  Curnes M.D.   On: 01/03/2014 15:43     EKG Interpretation None      MDM   MDM: 33 y.o WM w/ cc: of laceration to thumb while at work. 2 hours ago, RHD, unsure of tetanus status. No other complaints. No numbness or weakness. States having laarge amount of pain at R thumb at lac. Lac is palmar aspect, through deep tissue, wraps to radial aspect. NVI. No current bleeding. Concern for Open Fx, Will update Tetanus, give pain meds and obtain XR. XR with no fx. Lac repaired as above with good approximation. Dressed with gauze.  Will have f/u with hand surgeon. With it being complex thumb lac, we suggested patient to not work. He insisted that he work, even when explained risks of further damage, and infection. We will have f/u with Ortho Dr. Janee Morn in 2 days for wound recheck. Given Keflex rx. Pain medicine rx. Discharged. Care of case d/w my attending.  Final diagnoses:  Laceration of right thumb    Discharged  Pilar Jarvis, MD 01/03/14 2322

## 2014-01-03 NOTE — Discharge Instructions (Signed)

## 2014-01-03 NOTE — ED Notes (Signed)
Onset 12:45p pt at work and drill press kicked back and cut right thumb and abrasion to anterior index finger.  Pt immediately wrapped hand in rag, stated it was "squirting blood".  When ems arrived, they said it was oozing blood at that time.  EMS redressed wound.   Ems said at one point pt went SB and BP systolic dropped to 80's.  EMS gave fluids, put HOB down and BP and HR increased.  Gave Fentanyl 200 mcg.  Pain at 7/10 now.

## 2014-01-03 NOTE — ED Provider Notes (Signed)
I saw and evaluated the patient, reviewed the resident's note and I agree with the findings and plan.   EKG Interpretation None      Thumb laceration on dominant hand. Xray negative for fx. Tetanus updated here. NVI, good ROM, good cap refill. Will give Keflex. Repaired here, instructed to f/u with Dr. Janee Mornhompson in 2-3 days for recheck.  Dagmar HaitWilliam Lilya Smitherman, MD 01/03/14 [phone]

## 2015-05-06 IMAGING — CT CT ABD-PELV W/ CM
1 of 2 series · 15 of 32 positions shown, 19 images · IV contrast (100 ML OMNI 300)
Comparison: None.

CLINICAL DATA: Right upper quadrant abdominal pain and nausea.

EXAM:
CT ABDOMEN AND PELVIS WITH CONTRAST
TECHNIQUE: Multidetector CT imaging of the abdomen and pelvis was performed
using the standard protocol following bolus administration of
intravenous contrast.
CONTRAST:  100mL OMNIPAQUE IOHEXOL 300 MG/ML  SOLN

[Series 2: abd/pel with · axial · 0.78mm/px · z∈[+1076,+1536]mm · 15 of 100 slices shown, 19 images]
[im 4/100  soft-tissue]
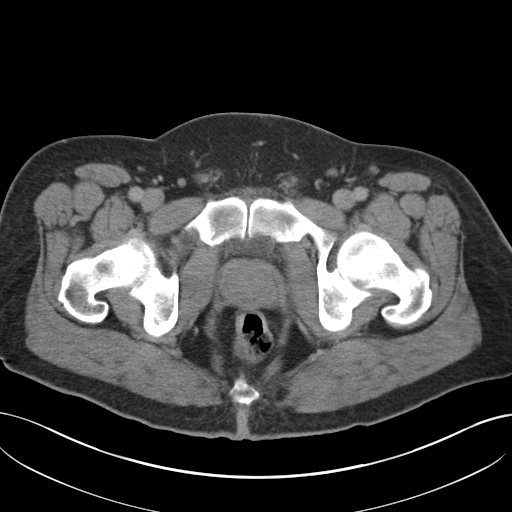
[im 4/100  bone]
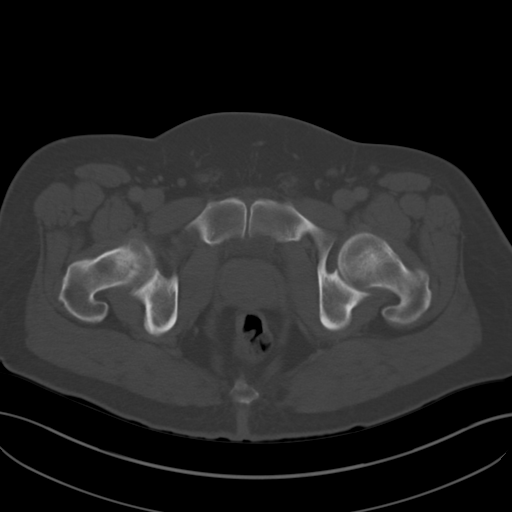
[im 12/100  soft-tissue]
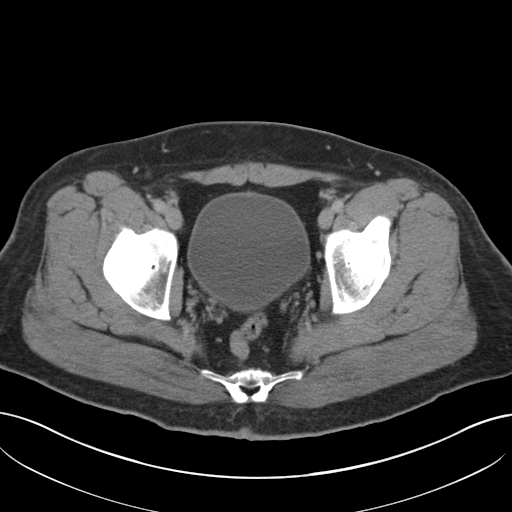
[im 20/100  soft-tissue]
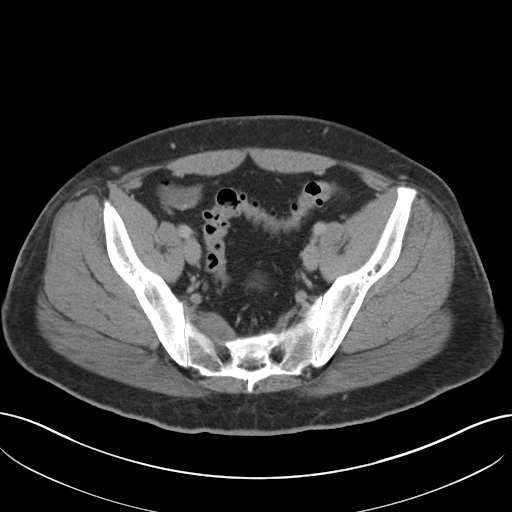
[im 28/100  soft-tissue]
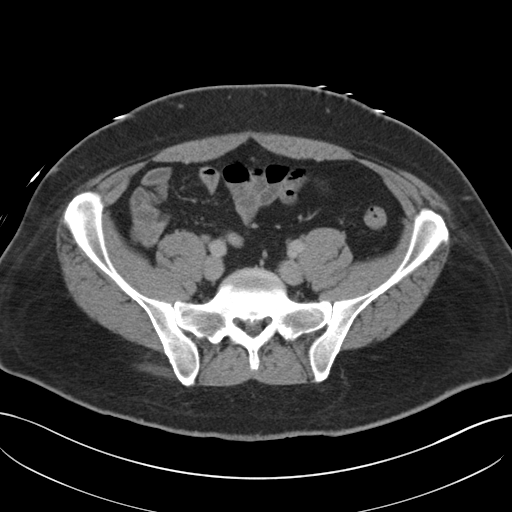
[im 36/100  soft-tissue]
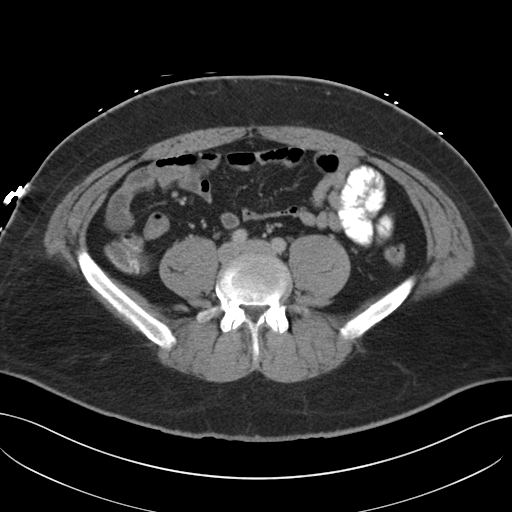
[im 44/100  soft-tissue]
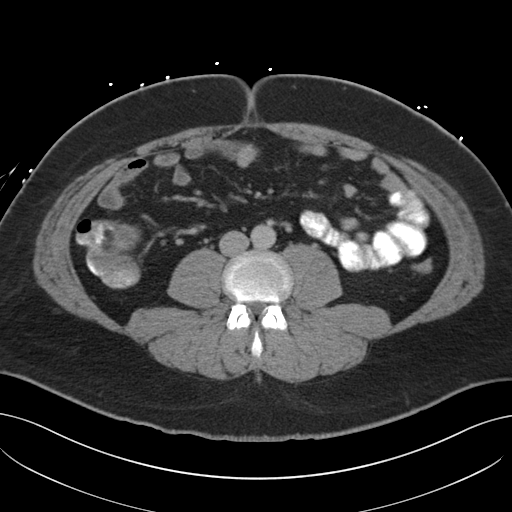
[im 52/100  soft-tissue]
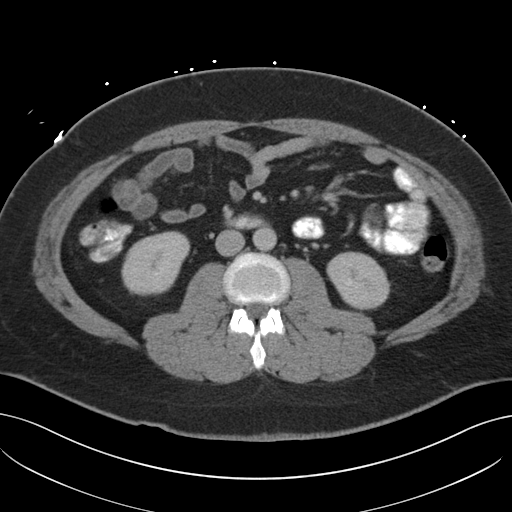
[im 56/100  soft-tissue]
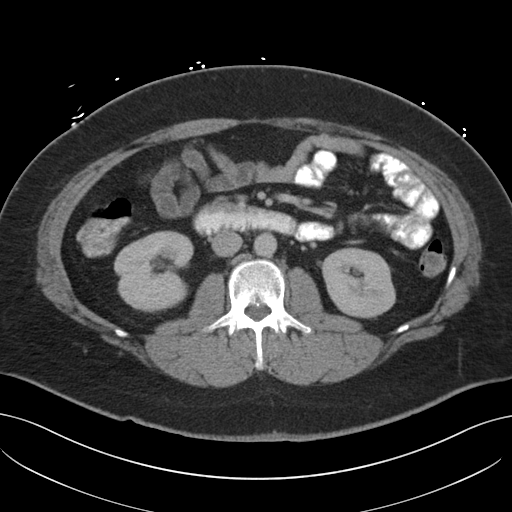
[im 64/100  soft-tissue]
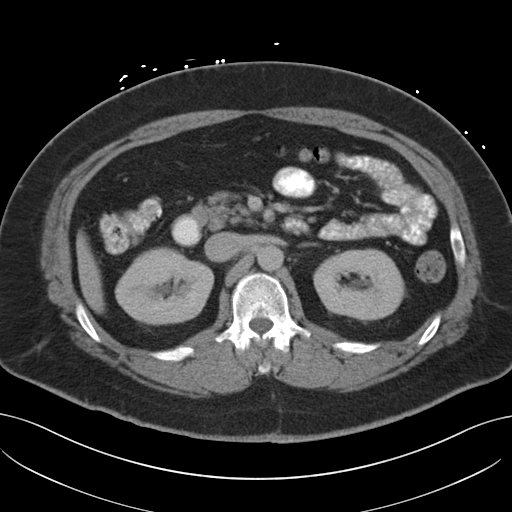
[im 64/100  bone]
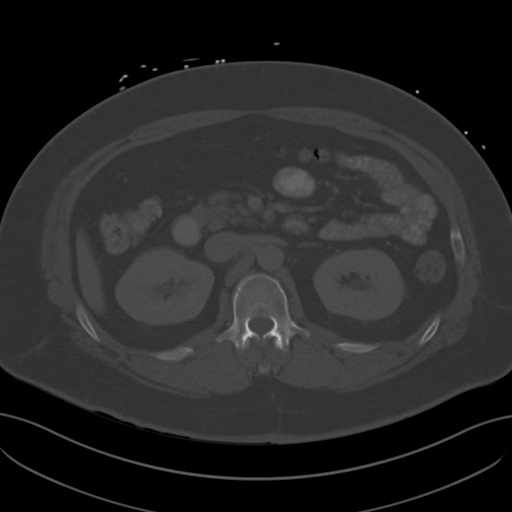
[im 72/100  soft-tissue]
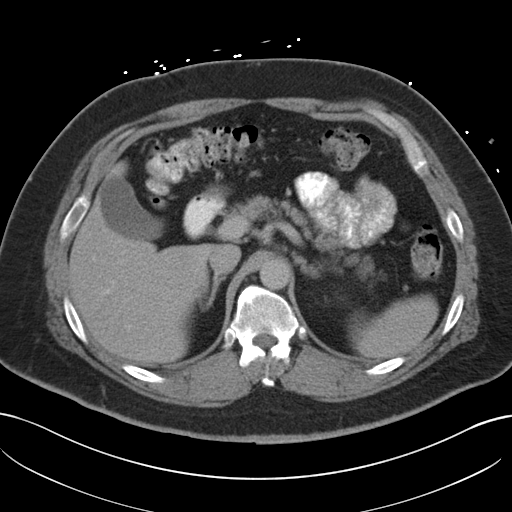
[im 80/100  soft-tissue]
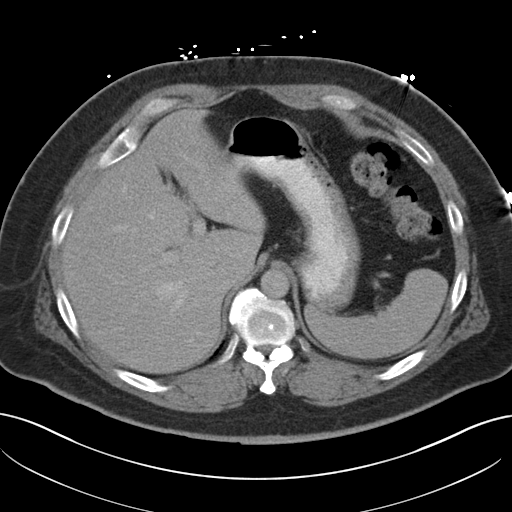
[im 84/100  lung]
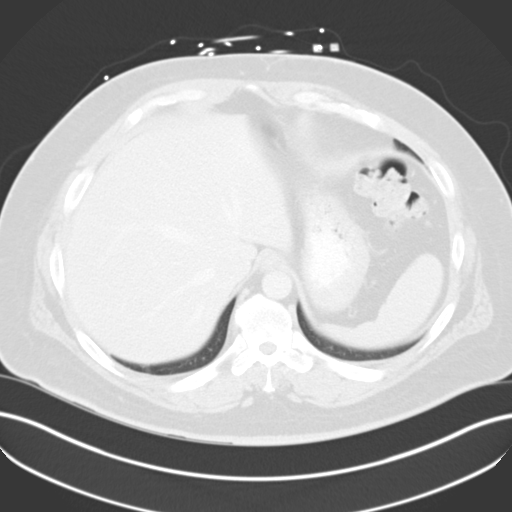
[im 88/100  soft-tissue]
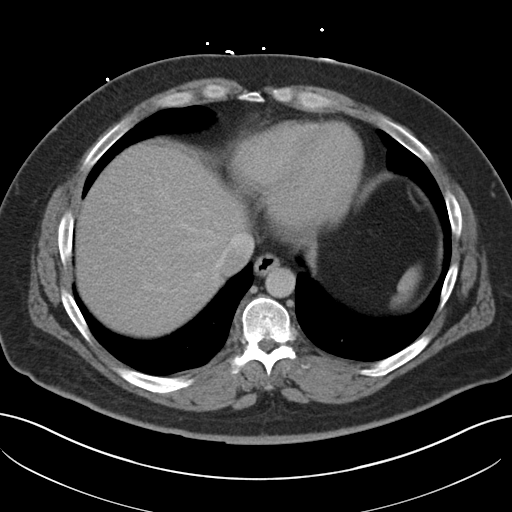
[im 88/100  lung]
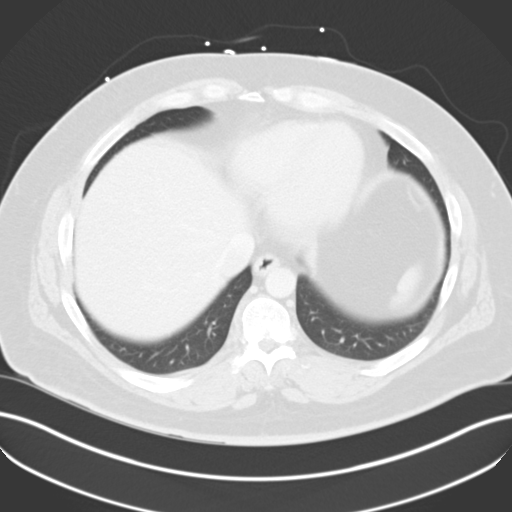
[im 92/100  lung]
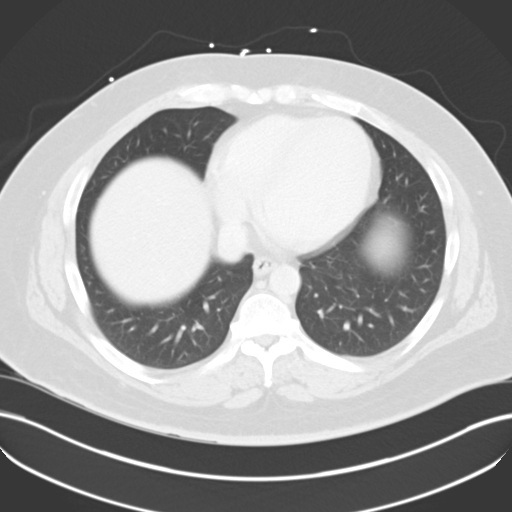
[im 96/100  soft-tissue]
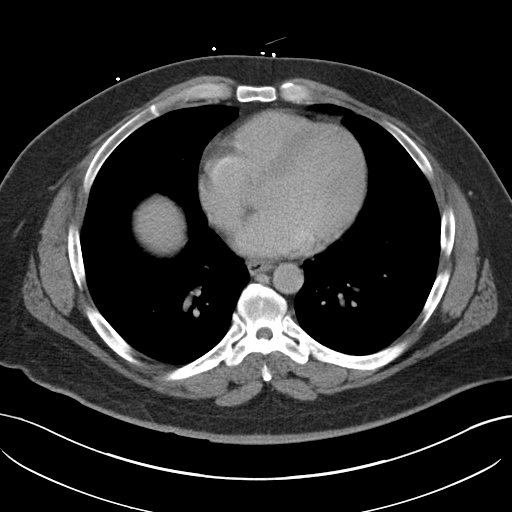
[im 96/100  lung]
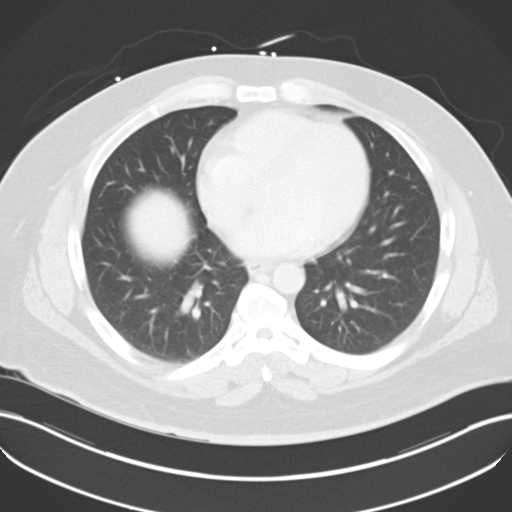

[15 of 32 positions shown; findings below may reference images not displayed]

FINDINGS: The visualized lung bases are clear.

The liver and spleen are unremarkable in appearance. The gallbladder
is within normal limits. The pancreas and adrenal glands are
unremarkable.

Minimal nonspecific perinephric stranding is noted bilaterally. The
kidneys are otherwise unremarkable in appearance. There is no
evidence of hydronephrosis. No renal or ureteral stones are seen.

No free fluid is identified. The small bowel is unremarkable in
appearance. The stomach is within normal limits. No acute vascular
abnormalities are seen.

The appendix is normal in caliber, without evidence for
appendicitis. Contrast progresses to the level of the mid transverse
colon. The colon is unremarkable in appearance.

The bladder is mildly distended and grossly unremarkable in
appearance. A small urachal remnant is incidentally seen. The
prostate remains normal in size. No inguinal lymphadenopathy is
seen.

No acute osseous abnormalities are identified.
IMPRESSION: Unremarkable contrast-enhanced CT of the abdomen and pelvis.

## 2015-08-16 IMAGING — CR DG FINGER THUMB 2+V*R*
2 series · 2 of 2 positions shown · non-contrast
Comparison: None.

CLINICAL DATA: Laceration.  Pain.

EXAM:
RIGHT THUMB 2+V

[pa obl]
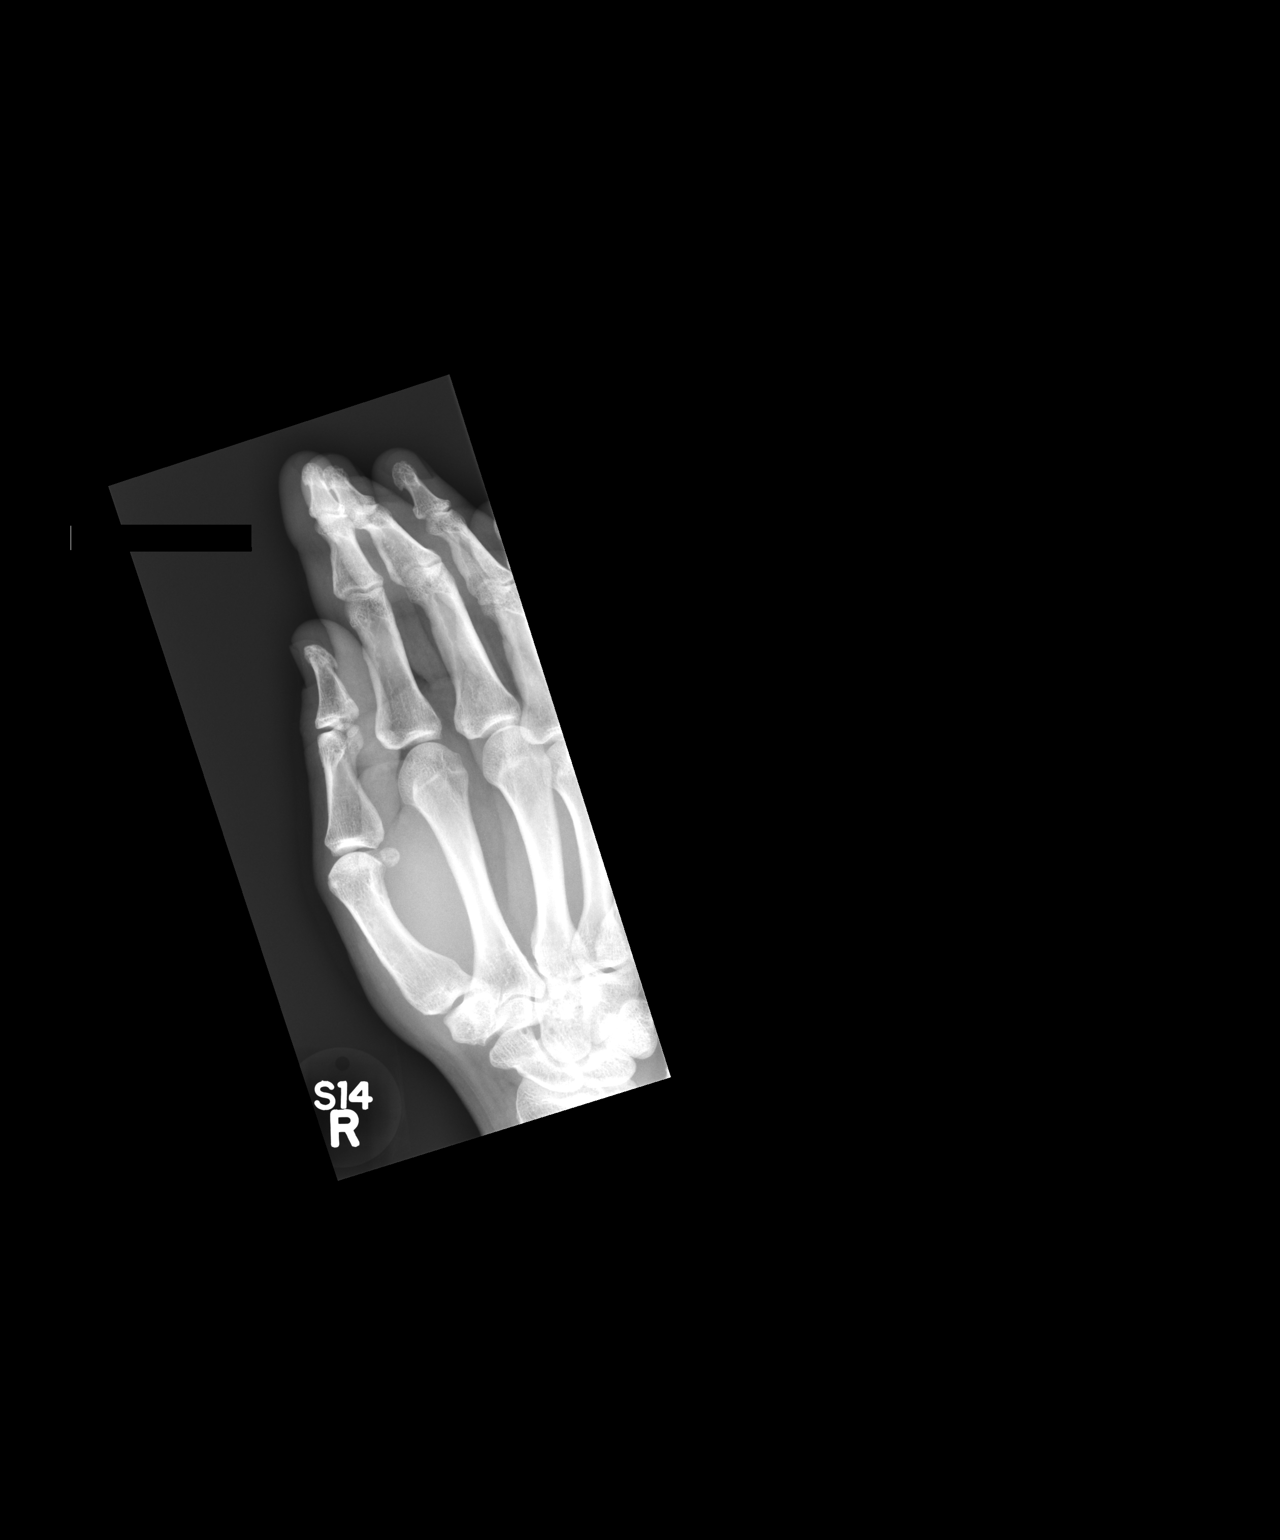

[lateral]
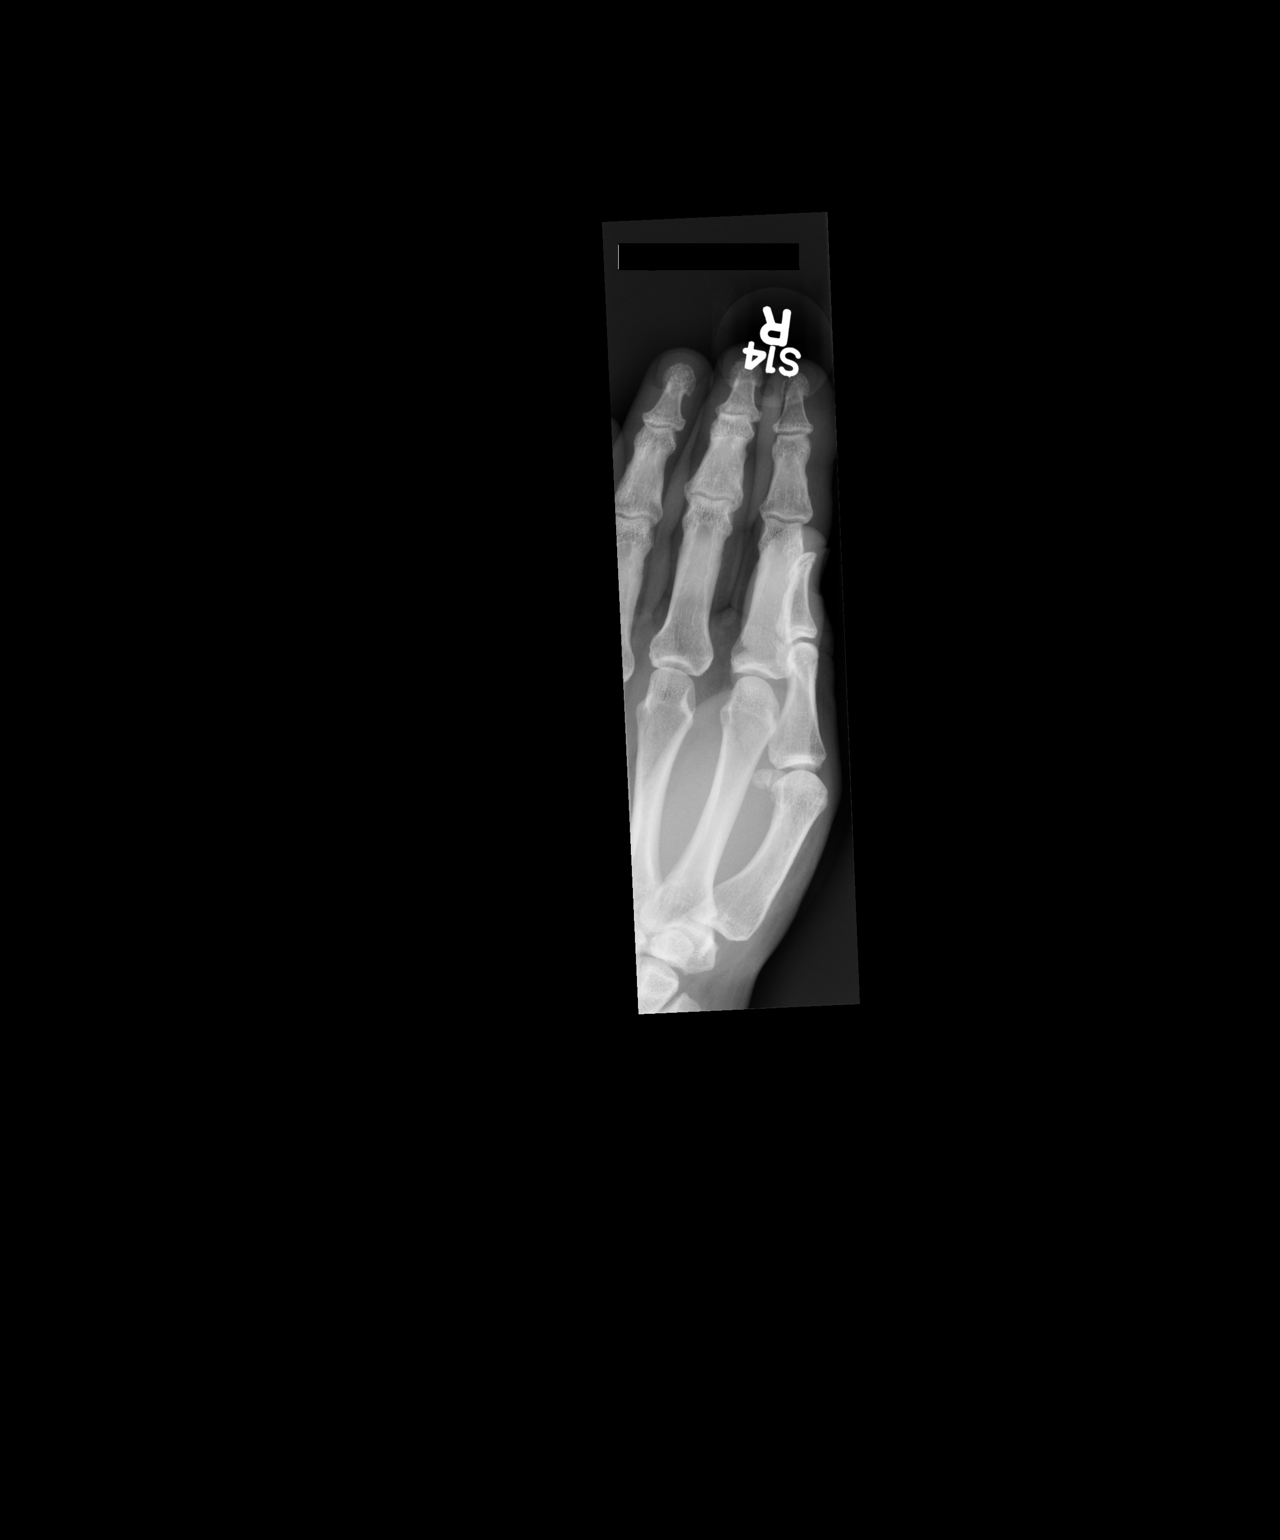

[2 of 2 positions shown; findings below may reference images not displayed]

FINDINGS: There is no evidence of fracture or dislocation. There is no
evidence of arthropathy or other focal bone abnormality. Soft
tissues are unremarkable
IMPRESSION: Negative.

## 2018-03-27 ENCOUNTER — Emergency Department
Admission: EM | Admit: 2018-03-27 | Discharge: 2018-03-27 | Disposition: A | Discharge status: Home / Self Care | Payer: Self-pay

## 2018-03-27 ENCOUNTER — Emergency Department (HOSPITAL_COMMUNITY): Payer: Self-pay

## 2018-03-27 ENCOUNTER — Encounter (HOSPITAL_COMMUNITY): Payer: Self-pay

## 2018-03-27 DIAGNOSIS — S0081XA Abrasion of other part of head, initial encounter: Secondary | ICD-10-CM | POA: Insufficient documentation

## 2018-03-27 DIAGNOSIS — W19XXXA Unspecified fall, initial encounter: Secondary | ICD-10-CM | POA: Insufficient documentation

## 2018-03-27 DIAGNOSIS — W138XXA Fall from, out of or through other building or structure, initial encounter: Secondary | ICD-10-CM

## 2018-03-27 DIAGNOSIS — S0990XA Unspecified injury of head, initial encounter: Secondary | ICD-10-CM | POA: Insufficient documentation

## 2018-03-27 DIAGNOSIS — S50812A Abrasion of left forearm, initial encounter: Secondary | ICD-10-CM | POA: Insufficient documentation

## 2018-03-27 DIAGNOSIS — M25562 Pain in left knee: Secondary | ICD-10-CM | POA: Insufficient documentation

## 2018-03-27 MED ORDER — ACETAMINOPHEN 300 MG-CODEINE 30 MG TABLET
1.0000 | ORAL_TABLET | ORAL | 0 refills | Status: AC | PRN
Start: 2018-03-27 — End: ?

## 2018-03-27 MED ORDER — DIPHTH,PERTUSSIS(ACELL),TETANUS 2.5 LF UNIT-8 MCG-5 LF/0.5 ML IM SUSP
0.50 mL | INTRAMUSCULAR | Status: AC
Start: 2018-03-27 — End: 2018-03-27
  Administered 2018-03-27: 0.5 mL via INTRAMUSCULAR
  Filled 2018-03-27: qty 0.5

## 2018-03-27 MED ADMIN — PRISMASATE (BICARBONATE-BASED) 4K DIALYSATE: INTRAMUSCULAR | @ 06:00:00 | NDC 09991000633

## 2018-03-27 NOTE — ED Nurses Note (Signed)
Ace wrap applied to Lt knee, crutches given to the patient

## 2018-03-27 NOTE — ED Provider Notes (Signed)
Department of Emergency Medicine           DOS: 03/27/18      Chief Complaint:  Patient presents with     Chief Complaint   Patient presents with   . Fall     patient reports falling 8-10 from a wall injuring left knee and hit head denies LOC        HPI    Corey Mercado is a 37 y.o. male who presents to the Emergency Department for evaluation following fall.  Patient states that fell approximately 8-10 feet off a wall 1 hr prior to arrival.  States that injury occurred around [facility].  States that he system wall frequently however tonight lost his balance landing directly on his left knee and forearm.  States that he struck his head but denies LOC.  Currently denies any headache, dizziness, blurry vision, neck pain.  Offers no other complaints.  Patient is not sure when his last tetanus was.       Past Medical History:  Diagnosis     History reviewed. No pertinent past medical history.    Past Surgical History:  History reviewed. No pertinent surgical history.    Family History: Reviewed with patient and noncontributory to current presentation.    Social History     Social History     Tobacco Use   . Smoking status: Never Smoker   . Smokeless tobacco: Never Used   Substance Use Topics   . Alcohol use: Not Currently   . Drug use: Not Currently       Medications:  Medications Prior to Admission     None          Allergy:  No Known Allergies    Review of Systems  Review of Systems   Constitutional: Negative for chills and fever.   HENT: Negative for congestion, rhinorrhea and sore throat.    Eyes: Negative for discharge and visual disturbance.   Respiratory: Negative for cough and shortness of breath.    Cardiovascular: Negative for chest pain and leg swelling.   Gastrointestinal: Negative for abdominal pain, constipation, diarrhea, nausea and vomiting.   Genitourinary: Negative for dysuria, frequency, hematuria and urgency.   Musculoskeletal: Negative for back pain and joint swelling.        Left forearm  pain, left knee pain   Skin: Negative for color change and rash.   Neurological: Negative for weakness and headaches.   All other systems reviewed and are negative.      Vitals:  Filed Vitals:    03/27/18 0456   BP: (!) 151/87   Pulse: 77   Resp: 18   Temp: 36.7 C (98.1 F)   SpO2: 98%       PHYSICAL EXAM:    Vital signs reviewed.  Nursing note reviewed.  Constitutional: alert, no acute distress, no obvious discomfort  HEENT:      Head: Normocephalic.  Abrasion right forehead      Eyes: Conjunctivae are normal. Pupils are equal, round, and reactive to light.       Ears: TMs normal bilateral. Canals normal      Nose: no discharge.       Mouth: moist mucous membranes. No pharyngeal erythema  Neck: Normal range of motion. Neck supple. No lymphadenopathy  Cardiovascular: Normal rate, regular rhythm. (+)S1/S2,  No murmurs, gallops or rubs  Pulmonary/Chest: Clear to auscultate bilaterally, no wheezing rales or rhonchi.  Abdominal: Soft. Bowel sounds are normal. Nontender. Nondistended. No  rebound or guarding  Upper Extremity:  Abrasion left forearm with tenderness and swelling to midforearm (+) radial pulses  Lower Extremity:  Abrasions to knees bilateral.  Tenderness to left distal femur/knee.  no clubbing, cyanosis or edema, (+) pedal pulses bilateral  Neurological: alert and oriented to person, place, and time. No focal motor or sensory deficits.  Pain with ambulation  Skin:   Warm, dry, well perfused, no rash.  See above  Psychiatric:  normal mood and affect.     Diagnostics:  Labs:  No results found for any visits on 03/27/18.    Radiology:  Results for orders placed or performed during the hospital encounter of 03/27/18   XR KNEE 4 OR MORE VIEWS LEFT     Status: None    Narrative    Froylan Lich    PROCEDURE DESCRIPTION: XR KNEE LEFT 4 OR MORE VIEWS    PROCEDURE PERFORMED DATE AND TIME: 03/27/2018 6:04 AM    CLINICAL INDICATION: fall, pain distal femur    TECHNIQUE: 4 views / 4 images submitted.    COMPARISON:  None    FINDINGS:  No fracture or dislocation seen.  No significant soft tissue  swelling.  Joint spaces are maintained. No significant knee joint effusion.      Impression    No acute osseous abnormality.        Radiologist location ID: [id number]     XR FOREARM LEFT     Status: None    Narrative    Jordany Niesen    PROCEDURE DESCRIPTION: XR FOREARM LEFT    PROCEDURE PERFORMED DATE AND TIME: 03/27/2018 6:03 AM    CLINICAL INDICATION: fall pain    TECHNIQUE: 2 views / 2 images submitted.    COMPARISON: None    FINDINGS:  No fracture or dislocation seen.  Mild dorsal soft tissue  swelling of the mid forearm. No radiopaque foreign body.      Impression    No acute osseous abnormality. Dorsal soft tissue swelling at  the mid forearm.        Radiologist location ID: [id number]     CT BRAIN WO IV CONTRAST     Status: None    Narrative    Cedrik Bath    PROCEDURE DESCRIPTION: CT BRAIN WO IV CONTRAST    PROCEDURE PERFORMED DATE AND TIME: 03/27/2018 6:27 AM    CT DOSE INFORMATION:  DLP (mGycm):  965.90 mGy.cm    CLINICAL INDICATION: fall from wall    COMPARISON: No prior studies were compared.      FINDINGS:  No intracranial hemorrhage. No intracranial mass effect. No  evidence for an acute infarct. The visualized osseous structures are  intact. The visualized paranasal sinuses and mastoid air cells are clear.      Impression    No acute intracranial process.                  The CT exam was performed using one or more the following a dose reduction  techniques: Automated exposure control, adjustment of the mA and/or kV  according to the patient's size, or use of iterative reconstruction  technique.        Radiologist [id number]       Radiology images and final radiologist report reviewed by me.    Medical Decision Making:    Plan: The following tests, imaging and medications will be ordered to investigate/rule out and treat the medical conditions to the best of our ability while  pt is in the emergency department.   Vitals will be monitored and pt will be rechecked in the ED.    Orders Placed This Encounter   . XR KNEE 4 OR MORE VIEWS LEFT   . XR FOREARM LEFT   . CT BRAIN WO IV CONTRAST   . diphtheria, pertussis-acell, tetanus (BOOSTRIX) IM injection   . acetaminophen-codeine (TYLENOL #3) 300-30 mg Oral Tablet   . DME - CRUTCHES       ED Course  ED Recheck: Patient doing well. Vitals stable.   . Results and plan reviewed with patient.  Patient agrees with discharge, treatment plan and outpatient follow-up.  Patient was given prescription for Tylenol 3 at time of discharge.  Patient was instructed return to ER if any concerns, new or worsening symptoms.  Prior to discharge patient was given opportunity to ask questions.  .     Disposition: Discharged      Encounter Diagnoses   Name Primary?   . Left knee pain Yes   . Abrasion of left forearm, initial encounter    . Forehead abrasion    . [address] DO  [place], DO  04/04/2018, 01:22

## 2018-03-27 NOTE — Discharge Instructions (Signed)
Any concerns, new or worsening symptoms return to ER for evaluation

## 2020-11-10 ENCOUNTER — Encounter: Payer: Self-pay | Admitting: Emergency Medicine

## 2020-11-10 ENCOUNTER — Ambulatory Visit
Admission: EM | Admit: 2020-11-10 | Discharge: 2020-11-10 | Disposition: A | Discharge status: Home / Self Care | Payer: Medicaid Other | Attending: Physician Assistant | Admitting: Physician Assistant

## 2020-11-10 ENCOUNTER — Other Ambulatory Visit: Payer: Self-pay

## 2020-11-10 DIAGNOSIS — H60391 Other infective otitis externa, right ear: Secondary | ICD-10-CM

## 2020-11-10 MED ORDER — OFLOXACIN 0.3 % OT SOLN: 10.0000 [drp] | Freq: Every day | OTIC | 0 refills | Status: DC

## 2020-11-10 NOTE — Discharge Instructions (Signed)
Use ear drops as prescribed Recommend Flonase Can alternate Ibuprofen and Tylenol as needed for pain If symptoms worsen or no improvement return here for evaluation or follow up with primary care.

## 2020-11-10 NOTE — ED Triage Notes (Signed)
Patient c/o RT sided ear ache, hearing difficulty, and headache x 1 day.   Patient endorses 11 days ago he started having "cold like" symptoms. Patient states that cold symptoms have subsided but the earache started yesterday.   Patient endorses taking Tylenol 500 mg every few hours for symptoms w/ no relief of symptoms.

## 2020-11-10 NOTE — ED Provider Notes (Signed)
[facility]    CSN: [id number] Arrival date & time: 11/10/20  0805      History   Chief Complaint Chief Complaint  Patient presents with  . Otalgia  . Headache    HPI Bryan Perez is a 40 y.o. male.   Pt complains of right ear pain that started yesterday.  Reports he experienced cold like sx for the last few weeks which have improved somewhat.  Reports he woke up yesterday with throbbing ear pain.  He denies fever, chilld, cough, congestion, sinus pressure or pain.  He is taking Tylenol with no relief.  Denies hearing difficulty.      Past Medical History:  Diagnosis Date  . Anxiety   . SVT (supraventricular tachycardia) [facility])     Patient Active Problem List   Diagnosis Date Noted  . ANXIETY 12/11/2010  . CARPAL TUNNEL SYNDROME 12/11/2010  . HYPERTENSION 12/11/2010  . SUPRAVENTRICULAR TACHYCARDIA 12/11/2010  . ASTHMA 12/11/2010  . SLEEP APNEA 12/11/2010  . PALPITATIONS 12/11/2010  . CHEST PAIN 12/11/2010    Past Surgical History:  Procedure Laterality Date  . DOPPLER ECHOCARDIOGRAPHY  11/22/2010   EF =>55%, LV NORM  . NM MYOCAR PERF WALL MOTION  11/22/2010   EF 57%,lv norm       Home Medications    Prior to Admission medications   Medication Sig Start Date End Date Taking? Authorizing Provider  acetaminophen (TYLENOL) 500 MG tablet Take 1,500 mg by mouth every 6 (six) hours as needed for mild pain.   Yes [provider]  ofloxacin (FLOXIN) 0.3 % OTIC solution Place 10 drops into the right ear daily. 11/10/20  Yes Jaimie Redditt, PA-C  ALPRAZolam Prudy Feeler) 1 MG tablet Take 0.5 mg by mouth 2 (two) times daily.    [provider]  amphetamine-dextroamphetamine (ADDERALL) 20 MG tablet Take 20 mg by mouth 2 (two) times daily.    [provider]  cephALEXin (KEFLEX) 500 MG capsule Take 1 capsule (500 mg total) by mouth 4 (four) times daily. 01/03/14   Pilar Jarvis, MD  oxyCODONE-acetaminophen (PERCOCET) 7.5-325 MG per  tablet Take 1 tablet by mouth every 4 (four) hours as needed for pain. 01/03/14   Pilar Jarvis, MD    Family History Family History  Problem Relation Age of Onset  . Heart attack Paternal Grandfather 20       first MI    Social History Social History   Tobacco Use  . Smoking status: Former Games developer  . Smokeless tobacco: Never Used  Substance Use Topics  . Alcohol use: Yes    Comment: occasionally   . Drug use: No     Allergies   Patient has no known allergies.   Review of Systems Review of Systems  Constitutional: Negative for chills and fever.  HENT: Positive for ear pain (right ear pain). Negative for sore throat.   Eyes: Negative for pain and visual disturbance.  Respiratory: Negative for cough and shortness of breath.   Cardiovascular: Negative for chest pain and palpitations.  Gastrointestinal: Negative for abdominal pain and vomiting.  Genitourinary: Negative for dysuria and hematuria.  Musculoskeletal: Negative for arthralgias and back pain.  Skin: Negative for color change and rash.  Neurological: Negative for seizures and syncope.  All other systems reviewed and are negative.    Physical Exam Triage Vital Signs ED Triage Vitals  Enc Vitals Group     BP 11/10/20 0815 134/83     Pulse Rate 11/10/20 0815 (!) 103  Resp 11/10/20 0815 16     Temp 11/10/20 0815 98.5 F (36.9 C)     Temp Source 11/10/20 0815 Oral     SpO2 11/10/20 0815 96 %     Weight 11/10/20 0814 291 lb 0.1 oz (132 kg)     Height 11/10/20 0814 6' (1.829 m)     Head Circumference --      Peak Flow --      Pain Score 11/10/20 0813 10     Pain Loc --      Pain Edu? --      Excl. in GC? --    No data found.  Updated Vital Signs BP 134/83 (BP Location: Left Arm)   Pulse (!) 103   Temp 98.5 F (36.9 C) (Oral)   Resp 16   Ht 6' (1.829 m)   Wt 291 lb 0.1 oz (132 kg)   SpO2 96%   BMI 39.47 kg/m   Visual Acuity Right Eye Distance:   Left Eye Distance:   Bilateral Distance:     Right Eye Near:   Left Eye Near:    Bilateral Near:     Physical Exam Vitals and nursing note reviewed.  Constitutional:      Appearance: He is well-developed and well-nourished.  HENT:     Head: Normocephalic and atraumatic.     Right Ear: Hearing normal. Swelling and tenderness present. No drainage.     Left Ear: Hearing, tympanic membrane, ear canal and external ear normal.     Ears:     Comments: Ear canal swollen and red.  Eyes:     Conjunctiva/sclera: Conjunctivae normal.  Cardiovascular:     Rate and Rhythm: Normal rate and regular rhythm.     Heart sounds: No murmur heard.   Pulmonary:     Effort: Pulmonary effort is normal. No respiratory distress.     Breath sounds: Normal breath sounds.  Abdominal:     Palpations: Abdomen is soft.     Tenderness: There is no abdominal tenderness.  Musculoskeletal:        General: No edema.     Cervical back: Neck supple.  Skin:    General: Skin is warm and dry.  Neurological:     Mental Status: He is alert.  Psychiatric:        Mood and Affect: Mood and affect normal.      UC Treatments / Results  Labs (all labs ordered are listed, but only abnormal results are displayed) Labs Reviewed - No data to display  EKG   Radiology No results found.  Procedures Procedures (including critical care time)  Medications Ordered in UC Medications - No data to display  Initial Impression / Assessment and Plan / UC Course  I have reviewed the triage vital signs and the nursing notes.  Pertinent labs & imaging results that were available during my care of the patient were reviewed by me and considered in my medical decision making (see chart for details).     Physical exam consistent with otitis externa, ear canal swollen, red, and painful on exam.  Antibiotic drops prescribed.  If no improvement advised to return for evaluation.   Final Clinical Impressions(s) / UC Diagnoses   Final diagnoses:  Infective otitis  externa of right ear     Discharge Instructions     Use ear drops as prescribed Recommend Flonase Can alternate Ibuprofen and Tylenol as needed for pain If symptoms worsen or no improvement return here for  evaluation or follow up with primary care.    ED Prescriptions    Medication Sig Dispense Auth. Provider   ofloxacin (FLOXIN) 0.3 % OTIC solution Place 10 drops into the right ear daily. 5 mL Jodell Cipro, PA-C     PDMP not reviewed this encounter.   Jodell Cipro, PA-C 11/10/20 [phone]

## 2020-12-14 ENCOUNTER — Other Ambulatory Visit: Payer: Self-pay

## 2020-12-14 ENCOUNTER — Ambulatory Visit
Admission: EM | Admit: 2020-12-14 | Discharge: 2020-12-14 | Disposition: A | Discharge status: Home / Self Care | Payer: Medicaid Other | Attending: Emergency Medicine | Admitting: Emergency Medicine

## 2020-12-14 DIAGNOSIS — H66001 Acute suppurative otitis media without spontaneous rupture of ear drum, right ear: Secondary | ICD-10-CM | POA: Diagnosis not present

## 2020-12-14 MED ORDER — AMOXICILLIN-POT CLAVULANATE 875-125 MG PO TABS: 1.0000 | ORAL_TABLET | Freq: Two times a day (BID) | ORAL | 0 refills | Status: AC

## 2020-12-14 NOTE — Discharge Instructions (Signed)
Begin Augmentin twice daily with food for the next 10 days Tylenol and ibuprofen for pain and discomfort Use Flonase nasal spray 1 to 2 spray in each nostril daily Follow-up for recheck if not improving over the next 1 to 2 weeks

## 2020-12-14 NOTE — ED Provider Notes (Signed)
[facility]    CSN: [id number] Arrival date & time: 12/14/20  0934      History   Chief Complaint Chief Complaint  Patient presents with  . Ear Fullness    right    HPI Bryan Perez is a 40 y.o. male presenting today for evaluation of right ear fullness.  Reports symptoms for 7 to 8 weeks.  Using over-the-counter medicines without relief of symptoms.  Was seen here initially after 1 day of symptoms and treated for otitis externa with ofloxacin drops.  Has continued to have muffled hearing fullness and discomfort on right side.  Reports some mild congestion and sinus discomfort on right side.  Denies fevers.  HPI  Past Medical History:  Diagnosis Date  . Anxiety   . SVT (supraventricular tachycardia) [facility])     Patient Active Problem List   Diagnosis Date Noted  . ANXIETY 12/11/2010  . CARPAL TUNNEL SYNDROME 12/11/2010  . HYPERTENSION 12/11/2010  . SUPRAVENTRICULAR TACHYCARDIA 12/11/2010  . ASTHMA 12/11/2010  . SLEEP APNEA 12/11/2010  . PALPITATIONS 12/11/2010  . CHEST PAIN 12/11/2010    Past Surgical History:  Procedure Laterality Date  . DOPPLER ECHOCARDIOGRAPHY  11/22/2010   EF =>55%, LV NORM  . NM MYOCAR PERF WALL MOTION  11/22/2010   EF 57%,lv norm       Home Medications    Prior to Admission medications   Medication Sig Start Date End Date Taking? Authorizing Provider  amoxicillin-clavulanate (AUGMENTIN) 875-125 MG tablet Take 1 tablet by mouth every 12 (twelve) hours for 10 days. 12/14/20 12/24/20 Yes Amillia Biffle C, PA-C  acetaminophen (TYLENOL) 500 MG tablet Take 1,500 mg by mouth every 6 (six) hours as needed for mild pain.    [provider]  ALPRAZolam Prudy Feeler) 1 MG tablet Take 0.5 mg by mouth 2 (two) times daily.    [provider]  amphetamine-dextroamphetamine (ADDERALL) 20 MG tablet Take 20 mg by mouth 2 (two) times daily.    [provider]  ofloxacin (FLOXIN) 0.3 % OTIC solution Place 10 drops into  the right ear daily. 11/10/20   Jodell Cipro, PA-C  oxyCODONE-acetaminophen (PERCOCET) 7.5-325 MG per tablet Take 1 tablet by mouth every 4 (four) hours as needed for pain. 01/03/14   Pilar Jarvis, MD    Family History Family History  Problem Relation Age of Onset  . Heart attack Paternal Grandfather 19       first MI    Social History Social History   Tobacco Use  . Smoking status: Former Games developer  . Smokeless tobacco: Never Used  Substance Use Topics  . Alcohol use: Yes    Comment: occasionally   . Drug use: No     Allergies   Patient has no known allergies.   Review of Systems Review of Systems  Constitutional: Negative for activity change, appetite change, chills, fatigue and fever.  HENT: Positive for ear pain and hearing loss. Negative for congestion, rhinorrhea, sinus pressure, sore throat and trouble swallowing.   Eyes: Negative for discharge and redness.  Respiratory: Negative for cough, chest tightness and shortness of breath.   Cardiovascular: Negative for chest pain.  Gastrointestinal: Negative for abdominal pain, diarrhea, nausea and vomiting.  Musculoskeletal: Negative for myalgias.  Skin: Negative for rash.  Neurological: Negative for dizziness, light-headedness and headaches.     Physical Exam Triage Vital Signs ED Triage Vitals  Enc Vitals Group     BP 12/14/20 0942 137/70     Pulse Rate 12/14/20  2423 (!) 125     Resp 12/14/20 0942 18     Temp 12/14/20 0942 98.5 F (36.9 C)     Temp Source 12/14/20 0942 Oral     SpO2 12/14/20 0942 96 %     Weight --      Height --      Head Circumference --      Peak Flow --      Pain Score 12/14/20 0941 0     Pain Loc --      Pain Edu? --      Excl. in GC? --    No data found.  Updated Vital Signs BP 137/70 (BP Location: Left Arm)   Pulse (!) 125   Temp 98.5 F (36.9 C) (Oral)   Resp 18   SpO2 96%   Visual Acuity Right Eye Distance:   Left Eye Distance:   Bilateral Distance:    Right Eye  Near:   Left Eye Near:    Bilateral Near:     Physical Exam Vitals and nursing note reviewed.  Constitutional:      Appearance: He is well-developed.     Comments: No acute distress  HENT:     Head: Normocephalic and atraumatic.     Ears:     Comments: Right canal with small erythematous papule noted towards lateral aspect of canal, TM dull opaque and irregular with slight erythema    Nose: Nose normal.  Eyes:     Conjunctiva/sclera: Conjunctivae normal.  Cardiovascular:     Rate and Rhythm: Normal rate.  Pulmonary:     Effort: Pulmonary effort is normal. No respiratory distress.  Abdominal:     General: There is no distension.  Musculoskeletal:        General: Normal range of motion.     Cervical back: Neck supple.  Skin:    General: Skin is warm and dry.  Neurological:     Mental Status: He is alert and oriented to person, place, and time.      UC Treatments / Results  Labs (all labs ordered are listed, but only abnormal results are displayed) Labs Reviewed - No data to display  EKG   Radiology No results found.  Procedures Procedures (including critical care time)  Medications Ordered in UC Medications - No data to display  Initial Impression / Assessment and Plan / UC Course  I have reviewed the triage vital signs and the nursing notes.  Pertinent labs & imaging results that were available during my care of the patient were reviewed by me and considered in my medical decision making (see chart for details).     Treating for otitis media with Augmentin, anti-inflammatories for pain, encouraged Flonase use.  Discussed strict return precautions. Patient verbalized understanding and is agreeable with plan.  Final Clinical Impressions(s) / UC Diagnoses   Final diagnoses:  Non-recurrent acute suppurative otitis media of right ear without spontaneous rupture of tympanic membrane     Discharge Instructions     Begin Augmentin twice daily with food for  the next 10 days Tylenol and ibuprofen for pain and discomfort Use Flonase nasal spray 1 to 2 spray in each nostril daily Follow-up for recheck if not improving over the next 1 to 2 weeks    ED Prescriptions    Medication Sig Dispense Auth. Provider   amoxicillin-clavulanate (AUGMENTIN) 875-125 MG tablet Take 1 tablet by mouth every 12 (twelve) hours for 10 days. 20 tablet Tahjae Clausing, [place] C, PA-C  PDMP not reviewed this encounter.   Lew Dawes, PA-C 12/14/20 1028

## 2020-12-14 NOTE — ED Triage Notes (Signed)
Pt present right ear fullness, symptoms started 7-8 weeks ago. Pt has tried OTC medication with no relief. Pt states the right ear is irritated.

## 2023-04-20 ENCOUNTER — Ambulatory Visit
Admission: EM | Admit: 2023-04-20 | Discharge: 2023-04-20 | Disposition: A | Discharge status: Home / Self Care | Payer: Medicaid Other | Attending: Nurse Practitioner | Admitting: Nurse Practitioner

## 2023-04-20 ENCOUNTER — Other Ambulatory Visit: Payer: Self-pay

## 2023-04-20 ENCOUNTER — Encounter: Payer: Self-pay | Admitting: *Deleted

## 2023-04-20 DIAGNOSIS — H60392 Other infective otitis externa, left ear: Secondary | ICD-10-CM | POA: Diagnosis not present

## 2023-04-20 MED ORDER — OFLOXACIN 0.3 % OT SOLN: 10.0000 [drp] | Freq: Every day | OTIC | 0 refills | Status: AC

## 2023-04-20 NOTE — ED Triage Notes (Addendum)
C/O starting with right ear pain 2 wks ago which resolved, but then started with left ear pain last night. Denies fevers. Denies rhinorrhea or nasal congestion. C/O some left ear drainage. Has been taking Tyl. Reports swimming in pool frequently

## 2023-04-20 NOTE — ED Provider Notes (Signed)
[facility]    CSN: [id number] Arrival date & time: 04/20/23  0859      History   Chief Complaint Chief Complaint  Patient presents with   Otalgia    HPI Bryan Perez is a 42 y.o. male.   Patient presents today with 2 week history of left ear pain.  No drainage from the ear, fever, cough, congestion or sore throat.  Reports when he lays on the left ear, it does feel like there is drainage but none comes out on the pillow case.  Also endorses decreased hearing from the left ear.  Reports he has been swimming a lot this year.  Has taken Tylenol which seemed to help with the pain minimally.    Past Medical History:  Diagnosis Date   Anxiety    SVT (supraventricular tachycardia)     Patient Active Problem List   Diagnosis Date Noted   ANXIETY 12/11/2010   CARPAL TUNNEL SYNDROME 12/11/2010   HYPERTENSION 12/11/2010   SUPRAVENTRICULAR TACHYCARDIA 12/11/2010   ASTHMA 12/11/2010   SLEEP APNEA 12/11/2010   PALPITATIONS 12/11/2010   CHEST PAIN 12/11/2010    Past Surgical History:  Procedure Laterality Date   DOPPLER ECHOCARDIOGRAPHY  11/22/2010   EF =>55%, LV NORM   NM MYOCAR PERF WALL MOTION  11/22/2010   EF 57%,lv norm       Home Medications    Prior to Admission medications   Medication Sig Start Date End Date Taking? Authorizing Provider  acetaminophen (TYLENOL) 500 MG tablet Take 1,500 mg by mouth every 6 (six) hours as needed for mild pain.   Yes [provider]  ALPRAZolam Prudy Feeler) 1 MG tablet Take 0.5 mg by mouth 2 (two) times daily.   Yes [provider]  amphetamine-dextroamphetamine (ADDERALL) 20 MG tablet Take 20 mg by mouth 2 (two) times daily.    [provider]  ofloxacin (FLOXIN) 0.3 % OTIC solution Place 10 drops into the left ear daily for 7 days. 04/20/23 04/27/23  Valentino Nose, NP  oxyCODONE-acetaminophen (PERCOCET) 7.5-325 MG per tablet Take 1 tablet by mouth every 4 (four) hours as needed for pain.  01/03/14   Pilar Jarvis, MD    Family History Family History  Problem Relation Age of Onset   Heart attack Paternal Grandfather 46       first MI    Social History Social History   Tobacco Use   Smoking status: Former   Smokeless tobacco: Never  Advertising account planner   Vaping status: Never Used  Substance Use Topics   Alcohol use: Not Currently   Drug use: No     Allergies   Patient has no known allergies.   Review of Systems Review of Systems Per HPI  Physical Exam Triage Vital Signs ED Triage Vitals  Encounter Vitals Group     BP 04/20/23 0958 126/81     Systolic BP Percentile --      Diastolic BP Percentile --      Pulse Rate 04/20/23 0958 98     Resp 04/20/23 0958 16     Temp 04/20/23 0958 98.5 F (36.9 C)     Temp Source 04/20/23 0958 Oral     SpO2 04/20/23 0958 96 %     Weight --      Height --      Head Circumference --      Peak Flow --      Pain Score 04/20/23 0959 0     Pain  Loc --      Pain Education --      Exclude from Growth Chart --    No data found.  Updated Vital Signs BP 126/81   Pulse 98   Temp 98.5 F (36.9 C) (Oral)   Resp 16   SpO2 96%   Visual Acuity Right Eye Distance:   Left Eye Distance:   Bilateral Distance:    Right Eye Near:   Left Eye Near:    Bilateral Near:     Physical Exam Vitals and nursing note reviewed.  Constitutional:      General: He is not in acute distress.    Appearance: Normal appearance. He is not toxic-appearing.  HENT:     Head: Normocephalic and atraumatic.     Right Ear: Tympanic membrane, ear canal and external ear normal. There is no impacted cerumen.     Left Ear: Decreased hearing noted. Swelling and tenderness present.  No middle ear effusion. Tympanic membrane is not perforated, erythematous or bulging.     Nose: Nose normal. No congestion or rhinorrhea.     Mouth/Throat:     Mouth: Mucous membranes are moist.     Pharynx: Oropharynx is clear. No oropharyngeal exudate.  Eyes:     General:  No scleral icterus.    Extraocular Movements: Extraocular movements intact.  Neurological:     Mental Status: He is alert.      UC Treatments / Results  Labs (all labs ordered are listed, but only abnormal results are displayed) Labs Reviewed - No data to display  EKG   Radiology No results found.  Procedures Procedures (including critical care time)  Medications Ordered in UC Medications - No data to display  Initial Impression / Assessment and Plan / UC Course  I have reviewed the triage vital signs and the nursing notes.  Pertinent labs & imaging results that were available during my care of the patient were reviewed by me and considered in my medical decision making (see chart for details).   Patient is well-appearing, normotensive, afebrile, not tachycardic, not tachypneic, oxygenating well on room air.    1. Infective otitis externa of left ear Treat with ofloxacin drops daily for 7 to 10 days Ear precautions discussed ER and return precautions also discussed with patient  The patient was given the opportunity to ask questions.  All questions answered to their satisfaction.  The patient is in agreement to this plan.    Final Clinical Impressions(s) / UC Diagnoses   Final diagnoses:  Infective otitis externa of left ear     Discharge Instructions      Use the ofloxacin ear drops daily for 7-10 days to treat swimmer's ear in your left ear.    Avoid getting any water in your ear for the next week or so, you can use a Vaseline-tipped Q tip to prevent water from entering your ear in the shower.    Seek care if symptoms persist or worsen despite treatment.    ED Prescriptions     Medication Sig Dispense Auth. Provider   ofloxacin (FLOXIN) 0.3 % OTIC solution Place 10 drops into the left ear daily for 7 days. 5 mL Valentino Nose, NP      PDMP not reviewed this encounter.   Valentino Nose, NP 04/20/23 1016

## 2023-04-20 NOTE — Discharge Instructions (Addendum)
Use the ofloxacin ear drops daily for 7-10 days to treat swimmer's ear in your left ear.    Avoid getting any water in your ear for the next week or so, you can use a Vaseline-tipped Q tip to prevent water from entering your ear in the shower.    Seek care if symptoms persist or worsen despite treatment.
